# Patient Record
Sex: Male | Born: 1990 | Race: White | Hispanic: No | Marital: Single | Smoking: Never smoker
Health system: Southern US, Community
[De-identification: ages and names within clinical notes are randomized; demographics above are authoritative.]

## PROBLEM LIST (undated history)

## (undated) HISTORY — PX: NOSE SURGERY: SHX723

---
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
## 2012-10-07 ENCOUNTER — Ambulatory Visit (INDEPENDENT_AMBULATORY_CARE_PROVIDER_SITE_OTHER): Payer: BC Managed Care – PPO | Admitting: Family Medicine

## 2012-10-07 ENCOUNTER — Encounter: Payer: Self-pay | Admitting: Family Medicine

## 2012-10-07 VITALS — BP 110/80 | HR 72 | Temp 99.2°F | Resp 12 | Ht 70.25 in | Wt 221.0 lb

## 2012-10-07 DIAGNOSIS — K219 Gastro-esophageal reflux disease without esophagitis: Secondary | ICD-10-CM | POA: Insufficient documentation

## 2012-10-07 MED ORDER — OMEPRAZOLE 40 MG PO CPDR: 40.0000 mg | DELAYED_RELEASE_CAPSULE | Freq: Every day | ORAL | Status: DC

## 2012-10-07 NOTE — Progress Notes (Signed)
  Subjective:    Patient ID: Hector Navarro, male    DOB: Jun 01, 1991, 22 y.o.   MRN: [id number]  HPI Here to establish care History of GERD treated for the past 5 years with omeprazole 40 mg daily Several times he's tried tapering this off but has had breakthrough symptoms. No dysphagia. No appetite or weight changes. He is aware of specific foods that aggravate and tries to avoid these. No history of nicotine or alcohol use  No prior surgeries. Family history significant for father had history of alcohol abuse. Mother bipolar disorder.  Patient is married. Just graduated from college. 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 No past surgical history on file.  reports that he has never smoked. He does not have any smokeless tobacco history on file. His alcohol and drug histories are not on file. family history is not on file. No Known Allergies    Review of Systems  Constitutional: Negative for appetite change and unexpected weight change.  Respiratory: Negative for cough and shortness of breath.   Cardiovascular: Negative for chest pain.  Gastrointestinal: Negative for nausea, vomiting and abdominal pain.       Objective:   Physical Exam  Constitutional: He appears well-developed and well-nourished.  Neck: Neck supple. No thyromegaly present.  Cardiovascular: Normal rate and regular rhythm.   No murmur heard. Pulmonary/Chest: Effort normal and breath sounds normal. No respiratory distress. He has no wheezes. He has no rales.          Assessment & Plan:  GERD. Well controlled with omeprazole. Instructional sheet given on GERD control/risk factor modification. Refill omeprazole.

## 2012-10-07 NOTE — Patient Instructions (Addendum)

## 2013-09-11 ENCOUNTER — Ambulatory Visit (INDEPENDENT_AMBULATORY_CARE_PROVIDER_SITE_OTHER): Payer: BC Managed Care – PPO | Admitting: Family Medicine

## 2013-09-11 ENCOUNTER — Encounter: Payer: Self-pay | Admitting: Family Medicine

## 2013-09-11 VITALS — BP 120/90 | HR 98 | Temp 97.8°F | Wt 213.0 lb

## 2013-09-11 DIAGNOSIS — F1810 Inhalant abuse, uncomplicated: Secondary | ICD-10-CM

## 2013-09-11 DIAGNOSIS — F1910 Other psychoactive substance abuse, uncomplicated: Secondary | ICD-10-CM

## 2013-09-11 NOTE — Patient Instructions (Signed)
Inhalant Abuse  Inhalant abuse is the intentional breathing in (inhalation) of substances to obtain a feeling of well being (euphoria). Inhalants produce vapors. When these vapors are inhaled, they produce a mind-altering effect. Although other abused substances can be inhaled, the term "inhalants" is used to describe substances that are most often inhaled, rather than taken in a different way. Inhalants are a broad range of chemicals that have different effects and are found in different products. These chemicals are put into four general categories. They are:   · Solvents.  · Aerosols.  · Gases.  · Nitrites.  Many of these substances are found in common household products like gasoline, carpet cleaner, spray paints, air fresheners, glues, lighter fluids, hair spray, nail polish remover and other solvents. These substances are abused by all ages.  HOW INHALANTS ARE USED  Inhalants can be breathed in through the nose or the mouth. This can be done in different ways, such as:  · Sniffing or snorting fumes from containers.  · Spraying aerosols directly into the nose or mouth.  · Bagging. This is sniffing or inhaling fumes from substances sprayed or put inside a plastic or paper bag.  · Huffing. This is when an inhalant-soaked rag is stuffed in the mouth.  · Inhaling from balloons filled with nitrous oxide.  HOW INHALANTS WORK  Most inhalants produce a rapid high that resembles alcohol intoxication. Inhaled chemicals are absorbed rapidly into the bloodstream through the lungs and quickly reach the brain and other organs. Within seconds of inhalation, the user experiences intoxication along with other effects. These effects may include:  · Slurred speech.  · Inability to coordinate movements.  · Euphoria.  · Dizziness.  · Lightheadedness.  · Hallucinations.  · Delusions.  Intoxication only lasts a few minutes so abusers often try to prolong the high. They do this by inhaling repeatedly over several hours. This is a very  dangerous practice. Abusers can suffer loss of consciousness and possibly even death. At the least, they will feel less inhibited and less in control. After heavy use of inhalants, abusers may feel drowsy for several hours and experience a lingering headache.  COMMONLY ABUSED INHALANTS AND THEIR EFFECTS:  · Amyl nitrite (common street names are poppers and pearls). Inhalation of these nitrites causes dizziness, lightheadedness, irregular heartbeat, blurred vision, headaches, nausea (feeling sick to your stomach), and burning in the nose. Because these medications enlarge small blood vessels, they can cause low blood pressure, fainting, and a rapid heart rate. This inhalant may also cause chest pain, shortness of breath, and confusion. Death can be an end result of using this inhalant.  · Nitrous oxide (also called laughing gas, buzz bomb, whippets, and shoot the breeze). Nitrous oxide is a gas used commonly in dentistry. The gas causes harm when people using it do not get enough oxygen. This can result in headaches, dizziness, excitation, depression of the central nervous system, seizures (convulsions) and death. Long-term use of this inhalant can cause nerve damage leading to weakness, uncoordinated or unsteady movements, and numbness and tingling in the arms and legs. This inhalant, when used over long periods of time, also leads to low red blood cells (anemia) and problems with blood clotting. When taken into the body, nitrous oxide actually is changed into nitric acid.  · Glues, sealants or glue-like chemicals. Inhaled into the lungs, these substances interfere with the transfer of oxygen to the bloodstream. The body can suffocate from lack of oxygen.  · Toluene. This   material is a solvent found in correction fluids, paint thinners and some nail-polish removers. It causes a brain tissue loss. It can also cause kidney problems, hearing and vision loss and damage to chromosomes.  · Benzene. This is a gasoline  additive. It causes serious injury to bone marrow and damage to the immune system. It is poisonous to sex organs.  · Trichloroethylene. This solvent found in glues, sealants and paints. It causes liver damage, injury to heart muscles and sex organs.  SUDDEN SNIFFING DEATH  The highly concentrated chemicals in solvents or aerosol sprays can induce an irregular and rapid heartbeat. It can also lead to a fatal heart failure within minutes of a session of prolonged sniffing. This syndrome, known as "sudden sniffing death," can result from a single session of inhalant use by an otherwise healthy young person.  Sudden sniffing death is associated most often with the abuse of butane, propane and chemicals in aerosols. Inhalant abuse also can cause death by:   · Asphyxiation from repeated inhalations, which leads to high concentrations of inhaled fumes displacing the available oxygen in the lungs.  · Suffocation from blocking air from entering the lungs when inhaling fumes from a plastic bag placed over the head.  · Convulsions or seizures from abnormal electrical discharges in the brain.  · Coma from the brain shutting down all but the most vital functions.  · Choking from inhalation of vomit after inhalant use.  · Fatal injury from accidents, including motor vehicle fatalities, suffered while intoxicated.  SIGNS OF INHALANT USE  · Breath and clothing that smell like chemicals.  · Spots or sores around the mouth.  · Paint or stains on body or clothing.  · Dazed or glassy-eyed look.  · Hidden empty spray paint or solvent containers and chemical- soaked rags or clothing.  · Nausea or loss of appetite.  · Anxiety, excitability or irritability.  · Slurred speech.  · Red or runny nose.  · Inattentiveness, lack of coordination, irritability, and depression.  TREATMENT   The initial treatment of inhalant use is usually supportive in the emergency department. Most often, if serious complications do not occur, the patient may be  allowed to go home with a responsible adult after a period of monitoring for a short time to several hours. IV (intravenous) therapy may be required for low blood pressures. Oxygen therapy is also necessary, along with removal of any inhalants of exposure (clothing, vials, cans, etc.). Cough is a common complication because the lungs get irritated.  X-ray studies may be performed to check the how the lungs are doing. Re-check with your caregiver after the radiologist (a specialist in reading x-rays) has read your x-rays. Do this to make sure your caregivers agree with the initial x-ray readings by the Emergency Department doctor. Do this also to determine when or if further studies or exams are needed. Ask your caregiver how they will inform you about your radiology (x-ray) follow up results.  After initial treatment has been administered and the immediate health concerns have been addressed, it is so important that substance abuse evaluation and treatment be started without delay. It is usually not possible to determine during an emergency hospital visit how serious the inhalant abuse situation may be, but anyone who receives hospital care for inhalant abuse is in danger of recurrence. It is not infrequent for inhalant abusers to suffer more serious complications (including death) before substance abuse treatment can occur.  HOME CARE INSTRUCTIONS   · Cough   suppressants may be used if you are losing rest. However, coughing is a protective mechanism for clearing our lungs. It is one reason to avoid cough suppressants. Sleeping in a semi-upright position in a recliner or using a couple of pillows will help with this.  · Only take over-the-counter or prescription medicines for pain, discomfort or fever as directed by your caregiver.  · Smoking is a common aggravating factor. Stop smoking.  · Get rest as you feel it is needed.  SEEK MEDICAL CARE IF:   · You have increasing shortness of breath.  · You cannot control  your cough with suppressants and are losing sleep.  · You begin coughing up blood.  · You develop pain that is getting worse or is uncontrolled with medications.  · You develop an oral temperature above 102° F (38.9° C) after not having a temperature for one or more days, or as your caregiver suggests.  · You cough up sputum (thick phlegm), or become sicker.  · If any of the symptoms that initially brought you in for care are getting worse rather than better, or are not controlled with medications.  · Continued abuse of inhalants occurs.  MAKE SURE YOU:   · Understand these instructions.  · Will watch your condition.  · Will get help right away if you are not doing well or get worse.  For more information call the National Inhalant Prevention Coalition's HOTLINE (800) 269-4237, or look on the web at www.inhalants.org.  Document Released: 09/25/2000 Document Revised: 09/11/2011 Document Reviewed: 08/13/2008  [employer]® Patient Information ©2014 [employer].

## 2013-09-11 NOTE — Progress Notes (Signed)
Subjective:    Patient ID: Hector Navarro, male    DOB: 05/29/1991, 23 y.o.   MRN: [id number]  HPI Patient is seen with several month history of inhalant abuse. Starting around last June he started using cleaning solution with difluroethane.  For several months he used this somewhat infrequently. Around October of last year he was found unconscious at his desk at work after using this inhaler and was fired. From November until January of this year he was using about one can per day about 4 days per week. Since early February he has escalated to about 2-3 cans per day about 4 days per week. He recently got into trouble with work because of frequent daytime absences from his work when he was going to his car to use the inhaler. He brought this to disclosure with his wife last night.  He is earnestly seeking help at this time. He has no history of prior abuse of any other drugs. Rarely uses alcohol. He does describe episode earlier this week where he regained normal consciousness and had some urine incontinence. He denies history of known seizure. With inhaler use he generally has some nausea but has euphoria, lightheadedness, and hallucinations. He has not had prior history of withdrawal type symptoms. He frequently does not use this on the weekends and has gone as long as 3 or 4 days without use without difficulty. He has no history of any cardiac problems. Generally feels well his time. No headaches. No dizziness.  He denies history of depression. He is ready to seek addiction counseling and treatment  Questionable history of IgA nephropathy in childhood otherwise no chronic medical problems  No past medical history on file. No past surgical history on file.  reports that he has never smoked. He does not have any smokeless tobacco history on file. His alcohol and drug histories are not on file. family history is not on file. No Known Allergies    Review of Systems  Constitutional: Negative  for fever, chills and unexpected weight change.  HENT: Positive for congestion and nosebleeds.   Respiratory: Negative for cough and shortness of breath.   Cardiovascular: Negative for chest pain, palpitations and leg swelling.  Gastrointestinal: Negative for abdominal pain.  Endocrine: Negative for polydipsia and polyuria.  Genitourinary: Negative for hematuria.  Neurological: Negative for dizziness and headaches.  Psychiatric/Behavioral: Negative for confusion and dysphoric mood.       Objective:   Physical Exam  Constitutional: He is oriented to person, place, and time. He appears well-developed and well-nourished.  HENT:  Right Ear: External ear normal.  Left Ear: External ear normal.  Mouth/Throat: Oropharynx is clear and moist.  Minimal crusted blood left naris  Neck: Neck supple. No thyromegaly present.  Cardiovascular: Normal rate and regular rhythm.   No murmur heard. Pulmonary/Chest: Effort normal and breath sounds normal. No respiratory distress. He has no wheezes. He has no rales.  Abdominal: Soft. He exhibits no distension and no mass. There is no tenderness. There is no rebound and no guarding.  Musculoskeletal: He exhibits no edema.  Lymphadenopathy:    He has no cervical adenopathy.  Neurological: He is alert and oriented to person, place, and time.  Psychiatric: He has a normal mood and affect. His behavior is normal. Judgment and thought content normal.          Assessment & Plan:  Inhalant abuse. We discussed multiple adverse risks including short and long-term risks. We discussed issues of possible sudden  sniffing death syndrome and risk of potentially fatal cardiac arrhythmias with any further use. He is aware that even one further use of this type of inhalant could lead to death. We had a long discussion with patient and wife about treatment options including possible inpatient treatment. At this point, they have already made contact with [facility]  for evaluation.  We obtained screening labs with CBC, basic metabolic panel, and hepatic panel. He is also aware of potential organ complications related to chronic inhalant abuse.

## 2013-09-11 NOTE — Progress Notes (Signed)
Pre visit review using our clinic review tool, if applicable. No additional management support is needed unless otherwise documented below in the visit note. 

## 2013-09-12 LAB — CBC WITH DIFFERENTIAL/PLATELET
BASOS ABS: 0 10*3/uL (ref 0.0–0.1)
Basophils Relative: 0.3 % (ref 0.0–3.0)
EOS ABS: 0.1 10*3/uL (ref 0.0–0.7)
Eosinophils Relative: 0.8 % (ref 0.0–5.0)
HCT: 47.8 % (ref 39.0–52.0)
Hemoglobin: 16.3 g/dL (ref 13.0–17.0)
Lymphocytes Relative: 22.9 % (ref 12.0–46.0)
Lymphs Abs: 1.8 10*3/uL (ref 0.7–4.0)
MCHC: 34.1 g/dL (ref 30.0–36.0)
MCV: 89.6 fl (ref 78.0–100.0)
Monocytes Absolute: 0.6 10*3/uL (ref 0.1–1.0)
Monocytes Relative: 7.5 % (ref 3.0–12.0)
NEUTROS PCT: 68.5 % (ref 43.0–77.0)
Neutro Abs: 5.5 10*3/uL (ref 1.4–7.7)
PLATELETS: 294 10*3/uL (ref 150.0–400.0)
RBC: 5.34 Mil/uL (ref 4.22–5.81)
RDW: 13.9 % (ref 11.5–14.6)
WBC: 8 10*3/uL (ref 4.5–10.5)

## 2013-09-12 LAB — BASIC METABOLIC PANEL
BUN: 10 mg/dL (ref 6–23)
CO2: 26 mEq/L (ref 19–32)
Calcium: 10.2 mg/dL (ref 8.4–10.5)
Chloride: 105 mEq/L (ref 96–112)
Creatinine, Ser: 1 mg/dL (ref 0.4–1.5)
GFR: 96.21 mL/min (ref >60.00)
Glucose, Bld: 99 mg/dL (ref 70–99)
Potassium: 4.5 mEq/L (ref 3.5–5.1)
Sodium: 141 mEq/L (ref 135–145)

## 2013-09-12 LAB — HEPATIC FUNCTION PANEL
ALBUMIN: 5.2 g/dL (ref 3.5–5.2)
ALT: 39 U/L (ref 0–53)
AST: 28 U/L (ref 0–37)
Alkaline Phosphatase: 92 U/L (ref 39–117)
BILIRUBIN DIRECT: 0 mg/dL (ref 0.0–0.3)
Total Bilirubin: 1.1 mg/dL (ref 0.3–1.2)
Total Protein: 8 g/dL (ref 6.0–8.3)

## 2013-10-06 ENCOUNTER — Other Ambulatory Visit: Payer: Self-pay | Admitting: Family Medicine

## 2014-01-06 ENCOUNTER — Ambulatory Visit (INDEPENDENT_AMBULATORY_CARE_PROVIDER_SITE_OTHER): Payer: BC Managed Care – PPO | Admitting: Family Medicine

## 2014-01-06 ENCOUNTER — Encounter: Payer: Self-pay | Admitting: Family Medicine

## 2014-01-06 VITALS — BP 128/80 | HR 76 | Temp 98.6°F | Ht 70.0 in | Wt 221.0 lb

## 2014-01-06 DIAGNOSIS — Z0000 Encounter for general adult medical examination without abnormal findings: Secondary | ICD-10-CM

## 2014-01-06 DIAGNOSIS — Z113 Encounter for screening for infections with a predominantly sexual mode of transmission: Secondary | ICD-10-CM

## 2014-01-06 DIAGNOSIS — Z111 Encounter for screening for respiratory tuberculosis: Secondary | ICD-10-CM

## 2014-01-06 NOTE — Patient Instructions (Signed)
Return in 48 to 72 hours to have PPD read.

## 2014-01-06 NOTE — Progress Notes (Signed)
Pre visit review using our clinic review tool, if applicable. No additional management support is needed unless otherwise documented below in the visit note. 

## 2014-01-06 NOTE — Progress Notes (Signed)
   Subjective:    Patient ID: Hector Navarro, male    DOB: January 22, 1991, 23 y.o.   MRN: [id number]  HPI  Here for well assessment/physical exam. Patient has history of addiction behavior with alcohol, inhalant abuse (difluroethane), and pornography. He was doing well until few weeks ago when he had relapse. He's been getting regular addiction counseling. He is preparing to enroll in one year [facility] program which will be an intense in residence one year program.  Nonsmoker and denies any other illicit drug use.  He has not had any history of communicable diseases but is requesting PPD testing along with STD screening. He is monogamous and has had no exposure to STD or prior history of STD. Denies any skin rashes or any urinary symptoms.  No past medical history on file. No past surgical history on file.  reports that he has never smoked. He does not have any smokeless tobacco history on file. His alcohol and drug histories are not on file. family history is not on file. No Known Allergies    Review of Systems  Constitutional: Negative for fever, activity change, appetite change and fatigue.  HENT: Negative for congestion, ear pain and trouble swallowing.   Eyes: Negative for pain and visual disturbance.  Respiratory: Negative for cough, shortness of breath and wheezing.   Cardiovascular: Negative for chest pain and palpitations.  Gastrointestinal: Negative for nausea, vomiting, abdominal pain, diarrhea, constipation, blood in stool, abdominal distention and rectal pain.  Genitourinary: Negative for dysuria, hematuria and testicular pain.  Musculoskeletal: Negative for arthralgias and joint swelling.  Skin: Negative for rash.  Neurological: Negative for dizziness, syncope and headaches.  Hematological: Negative for adenopathy.  Psychiatric/Behavioral: Negative for confusion and dysphoric mood.       Objective:   Physical Exam  Constitutional: He is oriented to person, place,  and time. He appears well-developed and well-nourished. No distress.  HENT:  Head: Normocephalic and atraumatic.  Right Ear: External ear normal.  Left Ear: External ear normal.  Mouth/Throat: Oropharynx is clear and moist.  Eyes: Conjunctivae and EOM are normal. Pupils are equal, round, and reactive to light.  Neck: Normal range of motion. Neck supple. No thyromegaly present.  Cardiovascular: Normal rate, regular rhythm and normal heart sounds.   No murmur heard. Pulmonary/Chest: No respiratory distress. He has no wheezes. He has no rales.  Abdominal: Soft. Bowel sounds are normal. He exhibits no distension and no mass. There is no tenderness. There is no rebound and no guarding.  Musculoskeletal: He exhibits no edema.  Lymphadenopathy:    He has no cervical adenopathy.  Neurological: He is alert and oriented to person, place, and time. He displays normal reflexes. No cranial nerve deficit.  Skin: No rash noted.  Psychiatric: He has a normal mood and affect.          Assessment & Plan:  #1 physical exam.  Tetanus estimated < 10 years.  Recent labs in March, so not obtained today. #2 STD screening. Check HIV, RPR, GC and Chlamydia by urine probe.no clinical suspicion of STD. #3 history of multiple addictions as above. Patient enrolling in program as above. PPD placed and return in 48-72 hours to have this read.

## 2014-01-07 LAB — RPR

## 2014-01-07 LAB — HIV ANTIBODY (ROUTINE TESTING W REFLEX): HIV 1&2 Ab, 4th Generation: NONREACTIVE

## 2014-01-08 LAB — TB SKIN TEST
INDURATION: 0 mm
TB Skin Test: NEGATIVE

## 2014-01-09 LAB — GC/CHLAMYDIA PROBE AMP, URINE
Chlamydia, Swab/Urine, PCR: NEGATIVE
GC PROBE AMP, URINE: NEGATIVE

## 2014-10-10 ENCOUNTER — Other Ambulatory Visit: Payer: Self-pay | Admitting: Family Medicine

## 2015-01-26 ENCOUNTER — Other Ambulatory Visit: Payer: Self-pay | Admitting: Family Medicine

## 2015-02-20 ENCOUNTER — Other Ambulatory Visit: Payer: Self-pay | Admitting: Family Medicine

## 2015-02-24 ENCOUNTER — Other Ambulatory Visit: Payer: Self-pay | Admitting: Family Medicine

## 2015-04-13 ENCOUNTER — Encounter: Payer: Self-pay | Admitting: Family Medicine

## 2015-04-13 ENCOUNTER — Ambulatory Visit (INDEPENDENT_AMBULATORY_CARE_PROVIDER_SITE_OTHER): Payer: BLUE CROSS/BLUE SHIELD | Admitting: Family Medicine

## 2015-04-13 VITALS — BP 120/90 | HR 82 | Temp 97.8°F | Ht 70.08 in | Wt 218.0 lb

## 2015-04-13 DIAGNOSIS — G2581 Restless legs syndrome: Secondary | ICD-10-CM | POA: Diagnosis not present

## 2015-04-13 DIAGNOSIS — F1810 Inhalant abuse, uncomplicated: Secondary | ICD-10-CM

## 2015-04-13 DIAGNOSIS — M545 Low back pain, unspecified: Secondary | ICD-10-CM

## 2015-04-13 DIAGNOSIS — Z0000 Encounter for general adult medical examination without abnormal findings: Secondary | ICD-10-CM

## 2015-04-13 DIAGNOSIS — F1811 Inhalant abuse, in remission: Secondary | ICD-10-CM

## 2015-04-13 LAB — POCT URINALYSIS DIPSTICK
BILIRUBIN UA: NEGATIVE
Glucose, UA: NEGATIVE
Ketones, UA: NEGATIVE
LEUKOCYTES UA: NEGATIVE
NITRITE UA: NEGATIVE
PH UA: 7
RBC UA: NEGATIVE
Spec Grav, UA: 1.02
UROBILINOGEN UA: 0.2

## 2015-04-13 LAB — CBC WITH DIFFERENTIAL/PLATELET
Basophils Absolute: 0.1 10*3/uL (ref 0.0–0.1)
Basophils Relative: 0.8 % (ref 0.0–3.0)
EOS PCT: 1.7 % (ref 0.0–5.0)
Eosinophils Absolute: 0.1 10*3/uL (ref 0.0–0.7)
HCT: 47.7 % (ref 39.0–52.0)
Hemoglobin: 16.4 g/dL (ref 13.0–17.0)
LYMPHS ABS: 2.4 10*3/uL (ref 0.7–4.0)
Lymphocytes Relative: 36.5 % (ref 12.0–46.0)
MCHC: 34.4 g/dL (ref 30.0–36.0)
MCV: 89.1 fl (ref 78.0–100.0)
MONO ABS: 0.7 10*3/uL (ref 0.1–1.0)
MONOS PCT: 10.8 % (ref 3.0–12.0)
NEUTROS ABS: 3.3 10*3/uL (ref 1.4–7.7)
NEUTROS PCT: 50.2 % (ref 43.0–77.0)
PLATELETS: 250 10*3/uL (ref 150.0–400.0)
RBC: 5.35 Mil/uL (ref 4.22–5.81)
RDW: 14.1 % (ref 11.5–15.5)
WBC: 6.6 10*3/uL (ref 4.0–10.5)

## 2015-04-13 LAB — BASIC METABOLIC PANEL
BUN: 16 mg/dL (ref 6–23)
CALCIUM: 10.2 mg/dL (ref 8.4–10.5)
CO2: 28 meq/L (ref 19–32)
Chloride: 103 mEq/L (ref 96–112)
Creatinine, Ser: 0.89 mg/dL (ref 0.40–1.50)
GFR: 112.27 mL/min (ref >60.00)
GLUCOSE: 86 mg/dL (ref 70–99)
POTASSIUM: 4.1 meq/L (ref 3.5–5.1)
SODIUM: 141 meq/L (ref 135–145)

## 2015-04-13 LAB — HEPATIC FUNCTION PANEL
ALBUMIN: 4.9 g/dL (ref 3.5–5.2)
ALT: 31 U/L (ref 0–53)
AST: 22 U/L (ref 0–37)
Alkaline Phosphatase: 44 U/L (ref 39–117)
Bilirubin, Direct: 0.1 mg/dL (ref 0.0–0.3)
TOTAL PROTEIN: 7.9 g/dL (ref 6.0–8.3)
Total Bilirubin: 0.8 mg/dL (ref 0.2–1.2)

## 2015-04-13 LAB — LIPID PANEL
Cholesterol: 201 mg/dL — ABNORMAL HIGH (ref 0–200)
HDL: 52.9 mg/dL (ref >39.00)
LDL Cholesterol: 125 mg/dL — ABNORMAL HIGH (ref 0–99)
NONHDL: 147.83
TRIGLYCERIDES: 116 mg/dL (ref 0.0–149.0)
Total CHOL/HDL Ratio: 4
VLDL: 23.2 mg/dL (ref 0.0–40.0)

## 2015-04-13 LAB — TSH: TSH: 1.47 u[IU]/mL (ref 0.35–4.50)

## 2015-04-13 MED ORDER — OMEPRAZOLE 40 MG PO CPDR: 40.0000 mg | DELAYED_RELEASE_CAPSULE | Freq: Every day | ORAL | Status: DC

## 2015-04-13 MED ORDER — TETANUS-DIPHTH-ACELL PERTUSSIS 5-2.5-18.5 LF-MCG/0.5 IM SUSP
0.5000 mL | Freq: Once | INTRAMUSCULAR | Status: AC
  Administered 2015-04-13: 0.5 mL via INTRAMUSCULAR

## 2015-04-13 MED ORDER — PRAMIPEXOLE DIHYDROCHLORIDE 0.125 MG PO TABS: ORAL_TABLET | ORAL | Status: DC

## 2015-04-13 NOTE — Progress Notes (Signed)
Subjective:    Patient ID: Hector Navarro, male    DOB: 12/18/1990, 24 y.o.   MRN: [id number]  HPI Patient seen for complete physical. He takes omeprazole for reflux and requesting refills. This is controlled on 40 mg. He has restless leg syndrome and has had frequent symptoms especially at night but symptoms also during the day. He is cautious to avoid caffeine. No alcohol use. No history of anemia. Symptoms are very disabling for him and his wife at times. He would like to explore other treatments. He's never been treated medically for this.  History of reported scoliosis. He has frequent low back pain lumbar area mostly on the right side. Has seen chiropractor in the past but has never had physical therapy. He has daily pain with bending. He is currently working with [employer] and lifting and bending does exacerbate  Question of IgA nephropathy in childhood. Apparently this was not well confirmed. He's apparently not had any proteinuria or other concerns all follow-up labs since then  He has history of substance abuse with inhalant abuse and finished a year-long treatment program recently and has done extremely well since then. No recent relapse  No past medical history on file. No past surgical history on file.  reports that he has never smoked. He does not have any smokeless tobacco history on file. His alcohol and drug histories are not on file. family history is not on file. No Known Allergies    Review of Systems  Constitutional: Negative for fever, activity change, appetite change, fatigue and unexpected weight change.  HENT: Negative for congestion, ear pain and trouble swallowing.   Eyes: Negative for pain and visual disturbance.  Respiratory: Negative for cough, shortness of breath and wheezing.   Cardiovascular: Negative for chest pain and palpitations.  Gastrointestinal: Negative for nausea, vomiting, abdominal pain, diarrhea, constipation, blood in stool,  abdominal distention and rectal pain.  Genitourinary: Negative for dysuria, hematuria and testicular pain.  Musculoskeletal: Positive for back pain. Negative for joint swelling, arthralgias and neck stiffness.  Skin: Negative for rash.  Neurological: Negative for dizziness, syncope and headaches.  Hematological: Negative for adenopathy.  Psychiatric/Behavioral: Negative for confusion and dysphoric mood.       Objective:   Physical Exam  Constitutional: He is oriented to person, place, and time. He appears well-developed and well-nourished. No distress.  HENT:  Head: Normocephalic and atraumatic.  Right Ear: External ear normal.  Left Ear: External ear normal.  Mouth/Throat: Oropharynx is clear and moist.  Eyes: Conjunctivae and EOM are normal. Pupils are equal, round, and reactive to light.  Neck: Normal range of motion. Neck supple. No thyromegaly present.  Cardiovascular: Normal rate, regular rhythm and normal heart sounds.   No murmur heard. Pulmonary/Chest: No respiratory distress. He has no wheezes. He has no rales.  Abdominal: Soft. Bowel sounds are normal. He exhibits no distension and no mass. There is no tenderness. There is no rebound and no guarding.  Musculoskeletal: He exhibits no edema.  Lymphadenopathy:    He has no cervical adenopathy.  Neurological: He is alert and oriented to person, place, and time. He displays normal reflexes. No cranial nerve deficit.  Skin: No rash noted.  Psychiatric: He has a normal mood and affect.          Assessment & Plan:  Complete physical. Obtain screening labs. Tetanus booster given. Set up physical therapy for his low back pain. He declines flu vaccine.  Restless leg syndrome. Continue to  avoid caffeine. Check labs as above. Symptoms are disabling-at times. We discussed options. Avoid drugs like Klonopin with history of substance abuse. Trial of Mirapex 0.125 mg daily at bedtime and titrate 0.25 mg after couple nights if  necessary

## 2015-04-13 NOTE — Patient Instructions (Signed)
Restless Legs Syndrome Restless legs syndrome is a condition that causes uncomfortable feelings or sensations in the legs, especially while sitting or lying down. The sensations usually cause an overwhelming urge to move the legs. The arms can also sometimes be affected. The condition can range from mild to severe. The symptoms often interfere with a person's ability to sleep. CAUSES The cause of this condition is not known. RISK FACTORS This condition is more likely to develop in:  People who are older than age 50.  Pregnant women. In general, restless legs syndrome is more common in women than in men.  People who have a family history of the condition.  People who have certain medical conditions, such as iron deficiency, kidney disease, Parkinson disease, or nerve damage.  People who take certain medicines, such as medicines for high blood pressure, nausea, colds, allergies, depression, and some heart conditions. SYMPTOMS The main symptom of this condition is uncomfortable sensations in the legs. These sensations may be:  Described as pulling, tingling, prickling, throbbing, crawling, or burning.  Worse while you are sitting or lying down.  Worse during periods of rest or inactivity.  Worse at night, often interfering with your sleep.  Accompanied by a very strong urge to move your legs.  Temporarily relieved by movement of your legs. The sensations usually affect both sides of the body. The arms can also be affected, but this is rare. People who have this condition often have tiredness during the day because of their lack of sleep at night. DIAGNOSIS This condition may be diagnosed based on your description of the symptoms. You may also have tests, including blood tests, to check for other conditions that may lead to your symptoms. In some cases, you may be asked to spend some time in a sleep lab so your sleeping can be monitored. TREATMENT Treatment for this condition is  focused on managing the symptoms. Treatment may include:  Self-help and lifestyle changes.  Medicines. HOME CARE INSTRUCTIONS  Take medicines only as directed by your health care provider.  Try these methods to get temporary relief from the uncomfortable sensations:  Massage your legs.  Walk or stretch.  Take a cold or hot bath.  Practice good sleep habits. For example, go to bed and get up at the same time every day.  Exercise regularly.  Practice ways of relaxing, such as yoga or meditation.  Avoid caffeine and alcohol.  Do not use any tobacco products, including cigarettes, chewing tobacco, or electronic cigarettes. If you need help quitting, ask your health care provider.  Keep all follow-up visits as directed by your health care provider. This is important. SEEK MEDICAL CARE IF: Your symptoms do not improve with treatment, or they get worse.   This information is not intended to replace advice given to you by your health care provider. Make sure you discuss any questions you have with your health care provider.   Document Released: 06/09/2002 Document Revised: 11/03/2014 Document Reviewed: 06/15/2014 Elsevier Interactive Patient Education 2016 Elsevier Inc.  

## 2015-04-13 NOTE — Progress Notes (Signed)
Pre visit review using our clinic review tool, if applicable. No additional management support is needed unless otherwise documented below in the visit note. 

## 2015-04-13 NOTE — Addendum Note (Signed)
Addended by: Shade Flood on: 04/13/2015 10:04 AM   Modules accepted: Orders

## 2015-04-14 ENCOUNTER — Telehealth: Payer: Self-pay

## 2015-04-14 NOTE — Telephone Encounter (Signed)
-----   Message from Kristian CoveyBruce W Burchette, MD sent at 04/13/2015  5:06 PM EDT ----- Labs all OK except for minimally elevated cholesterol.

## 2015-04-14 NOTE — Telephone Encounter (Signed)
Left message for patient to call office regarding labs.  First number called was incorrect so need updated phone number.

## 2015-05-12 ENCOUNTER — Other Ambulatory Visit: Payer: Self-pay

## 2016-01-19 ENCOUNTER — Telehealth: Payer: Self-pay | Admitting: Family Medicine

## 2016-01-19 MED ORDER — OMEPRAZOLE 40 MG PO CPDR: 40.0000 mg | DELAYED_RELEASE_CAPSULE | Freq: Every day | ORAL | Status: DC

## 2016-01-19 MED ORDER — PRAMIPEXOLE DIHYDROCHLORIDE 0.125 MG PO TABS: ORAL_TABLET | ORAL | Status: DC

## 2016-01-19 NOTE — Telephone Encounter (Signed)
Pt request refill  omeprazole (PRILOSEC) 40 MG capsule Pt has refills on this rx pramipexole (MIRAPEX) 0.125 MG tablet (transferred only, pt has refills)  But pt has new pharmacy and needs these sent to  [facility]

## 2016-01-19 NOTE — Telephone Encounter (Signed)
Appt are up to date, medications sent to new pharmacy.

## 2016-06-16 ENCOUNTER — Other Ambulatory Visit: Payer: Self-pay | Admitting: Family Medicine

## 2016-07-04 ENCOUNTER — Other Ambulatory Visit: Payer: Self-pay | Admitting: Family Medicine

## 2016-07-12 ENCOUNTER — Other Ambulatory Visit: Payer: Self-pay | Admitting: Family Medicine

## 2016-08-15 ENCOUNTER — Other Ambulatory Visit: Payer: Self-pay | Admitting: Family Medicine

## 2016-08-29 ENCOUNTER — Ambulatory Visit (INDEPENDENT_AMBULATORY_CARE_PROVIDER_SITE_OTHER): Payer: Self-pay | Admitting: Family Medicine

## 2016-08-29 ENCOUNTER — Encounter: Payer: Self-pay | Admitting: Family Medicine

## 2016-08-29 VITALS — BP 122/90 | HR 86 | Ht 70.5 in | Wt 218.9 lb

## 2016-08-29 DIAGNOSIS — Z0000 Encounter for general adult medical examination without abnormal findings: Secondary | ICD-10-CM

## 2016-08-29 MED ORDER — PRAMIPEXOLE DIHYDROCHLORIDE 0.125 MG PO TABS: ORAL_TABLET | ORAL | 3 refills | Status: DC

## 2016-08-29 MED ORDER — OMEPRAZOLE 40 MG PO CPDR: 40.0000 mg | DELAYED_RELEASE_CAPSULE | Freq: Every day | ORAL | 3 refills | Status: DC

## 2016-08-29 NOTE — Progress Notes (Signed)
Pre visit review using our clinic review tool, if applicable. No additional management support is needed unless otherwise documented below in the visit note. 

## 2016-08-29 NOTE — Progress Notes (Addendum)
Subjective:     Patient ID: Hector Navarro, male   DOB: Oct 29, 1990, 26 y.o.   MRN: [id number]  HPI Patient seen for physical exam. He has history of GERD and takes omeprazole 40 mg daily. Restless leg syndrome treated with Mirapex 0.125 mg 2 tablets at night and this is controlling his symptoms very well. He minimizes caffeine use. Still has occasional breakthrough GERD symptoms on omeprazole. When he skips medication he has consistent symptoms. He has not been able to taper down to H2 blockers.  Patient is nonsmoker. Married and expecting first child in April. He works with a [employer]. Tetanus up-to-date.  History reviewed. No pertinent past medical history. Past Surgical History:  Procedure Laterality Date  . NOSE SURGERY      reports that he has never smoked. He has never used smokeless tobacco. He reports that he does not drink alcohol or use drugs. family history includes Diabetes in his paternal grandmother; Hyperlipidemia in his maternal grandfather. No Known Allergies   Review of Systems  Constitutional: Negative for activity change, appetite change, fatigue and fever.  HENT: Negative for congestion, ear pain and trouble swallowing.   Eyes: Negative for pain and visual disturbance.  Respiratory: Negative for cough, shortness of breath and wheezing.   Cardiovascular: Negative for chest pain and palpitations.  Gastrointestinal: Negative for abdominal distention, abdominal pain, blood in stool, constipation, diarrhea, nausea, rectal pain and vomiting.  Genitourinary: Negative for dysuria, hematuria and testicular pain.  Musculoskeletal: Negative for arthralgias and joint swelling.  Skin: Negative for rash.  Neurological: Negative for dizziness, syncope and headaches.  Hematological: Negative for adenopathy.  Psychiatric/Behavioral: Negative for confusion and dysphoric mood.       Objective:   Physical Exam  Constitutional: He is oriented to person, place, and  time. He appears well-developed and well-nourished. No distress.  HENT:  Head: Normocephalic and atraumatic.  Right Ear: External ear normal.  Left Ear: External ear normal.  Mouth/Throat: Oropharynx is clear and moist.  Eyes: Conjunctivae and EOM are normal. Pupils are equal, round, and reactive to light.  Neck: Normal range of motion. Neck supple. No thyromegaly present.  Cardiovascular: Normal rate, regular rhythm and normal heart sounds.   No murmur heard. Pulmonary/Chest: No respiratory distress. He has no wheezes. He has no rales.  Abdominal: Soft. Bowel sounds are normal. He exhibits no distension and no mass. There is no tenderness. There is no rebound and no guarding.  Musculoskeletal: He exhibits no edema.  Lymphadenopathy:    He has no cervical adenopathy.  Neurological: He is alert and oriented to person, place, and time. He displays normal reflexes. No cranial nerve deficit.  Skin: Rash noted.  Patient has slightly hyperpigmented macular rash which is slightly scaly across his Topping chest wall scattered in a few areas  Psychiatric: He has a normal mood and affect.       Assessment:     Physical exam. Patient is GERD which is well-controlled omeprazole restless leg syndrome controlled with Mirapex.  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 He has very minimal involvement of this point wishes not to treat currently. Consider either oral ketoconazole one-time  dose or topical selenium sulfide for any progressive rash  Kristian CoveyBruce W Corianna Avallone MD [facility]

## 2016-08-29 NOTE — Patient Instructions (Signed)
Tinea Versicolor Tinea versicolor is a common fungal infection of the skin. It causes a rash that appears as light or dark patches on the skin. The rash most often occurs on the chest, back, neck, or [place] arms. This condition is more common during warm weather. Other than affecting how your skin looks, tinea versicolor usually does not cause other problems. In most cases, the infection goes away in a few weeks with treatment. It may take a few months for the patches on your skin to clear up. What are the causes? Tinea versicolor occurs when a type of fungus that is normally present on the skin starts to overgrow. This fungus is a kind of yeast. The exact cause of the overgrowth is not known. This condition cannot be passed from one person to another (noncontagious). What increases the risk? This condition is more likely to develop when certain factors are present, such as:  Heat and humidity.  Sweating too much.  Hormone changes.  Oily skin.  A weak defense (immune) system. What are the signs or symptoms? Symptoms of this condition may include:  A rash on your skin that is made up of light or dark patches. The rash may have:  Patches of tan or pink spots on light skin.  Patches of white or brown spots on dark skin.  Patches of skin that do not tan.  Well-marked edges.  Scales on the discolored areas.  Mild itching. How is this diagnosed? A health care provider can usually diagnose this condition by looking at your skin. During the exam, he or she may use ultraviolet light to help determine the extent of the infection. In some cases, a skin sample may be taken by scraping the rash. This sample will be viewed under a microscope to check for yeast overgrowth. How is this treated? Treatment for this condition may include:  Dandruff shampoo that is applied to the affected skin during showers or bathing.  Over-the-counter medicated skin cream, lotion, or soaps.  Prescription  antifungal medicine in the form of skin cream or pills.  Medicine to help reduce itching. Follow these instructions at home:  Take medicines only as directed by your health care provider.  Apply dandruff shampoo to the affected area if told to do so by your health care provider. You may be instructed to scrub the affected skin for several minutes each day.  Do not scratch the affected area of skin.  Avoid hot and humid conditions.  Do not use tanning booths.  Try to avoid sweating a lot. Contact a health care provider if:  Your symptoms get worse.  You have a fever.  You have redness, swelling, or pain at the site of your rash.  You have fluid, blood, or pus coming from your rash.  Your rash returns after treatment. This information is not intended to replace advice given to you by your health care provider. Make sure you discuss any questions you have with your health care provider. Document Released: 06/16/2000 Document Revised: 02/20/2016 Document Reviewed: 03/31/2014 Elsevier Interactive Patient Education  2017 Elsevier Inc.  

## 2017-08-13 ENCOUNTER — Telehealth: Payer: Self-pay | Admitting: Family Medicine

## 2017-08-13 MED ORDER — PRAMIPEXOLE DIHYDROCHLORIDE 0.125 MG PO TABS: ORAL_TABLET | ORAL | 0 refills | Status: DC

## 2017-08-13 MED ORDER — OMEPRAZOLE 40 MG PO CPDR: 40.0000 mg | DELAYED_RELEASE_CAPSULE | Freq: Every day | ORAL | 0 refills | Status: DC

## 2017-08-13 NOTE — Telephone Encounter (Signed)
Copied from CRM [id number]. Topic: Quick Communication - Rx Refill/Question >> Aug 13, 2017  9:50 AM Alexander BergeronBarksdale, Hector B wrote: Medication: omeprazole (PRILOSEC) 40 MG capsule [[id number]][[id number]] ,  pramipexole (MIRAPEX) 0.125 MG tablet [[id number]][[id number]]   Pt would like a refill, have them sent to the [facility] on [address] [place]

## 2017-08-13 NOTE — Telephone Encounter (Signed)
Refills sent

## 2017-09-05 ENCOUNTER — Ambulatory Visit (INDEPENDENT_AMBULATORY_CARE_PROVIDER_SITE_OTHER): Payer: 59 | Admitting: Family Medicine

## 2017-09-05 ENCOUNTER — Encounter: Payer: Self-pay | Admitting: Family Medicine

## 2017-09-05 VITALS — BP 110/80 | HR 78 | Temp 98.6°F | Ht 70.5 in | Wt 230.0 lb

## 2017-09-05 DIAGNOSIS — Z0000 Encounter for general adult medical examination without abnormal findings: Secondary | ICD-10-CM

## 2017-09-05 LAB — CBC WITH DIFFERENTIAL/PLATELET
BASOS PCT: 0.9 % (ref 0.0–3.0)
Basophils Absolute: 0.1 10*3/uL (ref 0.0–0.1)
EOS PCT: 1.9 % (ref 0.0–5.0)
Eosinophils Absolute: 0.1 10*3/uL (ref 0.0–0.7)
HCT: 46.5 % (ref 39.0–52.0)
Hemoglobin: 16.4 g/dL (ref 13.0–17.0)
LYMPHS ABS: 2.2 10*3/uL (ref 0.7–4.0)
Lymphocytes Relative: 35.1 % (ref 12.0–46.0)
MCHC: 35.4 g/dL (ref 30.0–36.0)
MCV: 84.8 fl (ref 78.0–100.0)
MONOS PCT: 9.9 % (ref 3.0–12.0)
Monocytes Absolute: 0.6 10*3/uL (ref 0.1–1.0)
NEUTROS ABS: 3.3 10*3/uL (ref 1.4–7.7)
NEUTROS PCT: 52.2 % (ref 43.0–77.0)
PLATELETS: 260 10*3/uL (ref 150.0–400.0)
RBC: 5.48 Mil/uL (ref 4.22–5.81)
RDW: 14.4 % (ref 11.5–15.5)
WBC: 6.3 10*3/uL (ref 4.0–10.5)

## 2017-09-05 LAB — HEPATIC FUNCTION PANEL
ALBUMIN: 4.8 g/dL (ref 3.5–5.2)
ALK PHOS: 38 U/L — AB (ref 39–117)
ALT: 85 U/L — ABNORMAL HIGH (ref 0–53)
AST: 26 U/L (ref 0–37)
Bilirubin, Direct: 0.2 mg/dL (ref 0.0–0.3)
Total Bilirubin: 0.7 mg/dL (ref 0.2–1.2)
Total Protein: 7.4 g/dL (ref 6.0–8.3)

## 2017-09-05 LAB — BASIC METABOLIC PANEL
BUN: 14 mg/dL (ref 6–23)
CALCIUM: 10.2 mg/dL (ref 8.4–10.5)
CO2: 28 mEq/L (ref 19–32)
Chloride: 105 mEq/L (ref 96–112)
Creatinine, Ser: 1.08 mg/dL (ref 0.40–1.50)
GFR: 88.03 mL/min (ref >60.00)
Glucose, Bld: 91 mg/dL (ref 70–99)
POTASSIUM: 4.3 meq/L (ref 3.5–5.1)
SODIUM: 142 meq/L (ref 135–145)

## 2017-09-05 LAB — LIPID PANEL
Cholesterol: 159 mg/dL (ref 0–200)
HDL: 37.4 mg/dL — AB (ref >39.00)
LDL Cholesterol: 101 mg/dL — ABNORMAL HIGH (ref 0–99)
NonHDL: 121.94
TRIGLYCERIDES: 105 mg/dL (ref 0.0–149.0)
Total CHOL/HDL Ratio: 4
VLDL: 21 mg/dL (ref 0.0–40.0)

## 2017-09-05 LAB — TSH: TSH: 2.48 u[IU]/mL (ref 0.35–4.50)

## 2017-09-05 MED ORDER — FLUCONAZOLE 150 MG PO TABS: ORAL_TABLET | ORAL | 2 refills | Status: DC

## 2017-09-05 MED ORDER — OMEPRAZOLE 40 MG PO CPDR: 40.0000 mg | DELAYED_RELEASE_CAPSULE | Freq: Every day | ORAL | 11 refills | Status: DC

## 2017-09-05 MED ORDER — PRAMIPEXOLE DIHYDROCHLORIDE 0.125 MG PO TABS: ORAL_TABLET | ORAL | 11 refills | Status: DC

## 2017-09-05 NOTE — Patient Instructions (Signed)
Tinea Versicolor Tinea versicolor is a common fungal infection of the skin. It causes a rash that appears as light or dark patches on the skin. The rash most often occurs on the chest, back, neck, or [place] arms. This condition is more common during warm weather. Other than affecting how your skin looks, tinea versicolor usually does not cause other problems. In most cases, the infection goes away in a few weeks with treatment. It may take a few months for the patches on your skin to clear up. What are the causes? Tinea versicolor occurs when a type of fungus that is normally present on the skin starts to overgrow. This fungus is a kind of yeast. The exact cause of the overgrowth is not known. This condition cannot be passed from one person to another (noncontagious). What increases the risk? This condition is more likely to develop when certain factors are present, such as:  Heat and humidity.  Sweating too much.  Hormone changes.  Oily skin.  A weak defense (immune) system.  What are the signs or symptoms? Symptoms of this condition may include:  A rash on your skin that is made up of light or dark patches. The rash may have: ? Patches of tan or pink spots on light skin. ? Patches of white or brown spots on dark skin. ? Patches of skin that do not tan. ? Well-marked edges. ? Scales on the discolored areas.  Mild itching.  How is this diagnosed? A health care provider can usually diagnose this condition by looking at your skin. During the exam, he or she may use ultraviolet light to help determine the extent of the infection. In some cases, a skin sample may be taken by scraping the rash. This sample will be viewed under a microscope to check for yeast overgrowth. How is this treated? Treatment for this condition may include:  Dandruff shampoo that is applied to the affected skin during showers or bathing.  Over-the-counter medicated skin cream, lotion, or soaps.  Prescription  antifungal medicine in the form of skin cream or pills.  Medicine to help reduce itching.  Follow these instructions at home:  Take medicines only as directed by your health care provider.  Apply dandruff shampoo to the affected area if told to do so by your health care provider. You may be instructed to scrub the affected skin for several minutes each day.  Do not scratch the affected area of skin.  Avoid hot and humid conditions.  Do not use tanning booths.  Try to avoid sweating a lot. Contact a health care provider if:  Your symptoms get worse.  You have a fever.  You have redness, swelling, or pain at the site of your rash.  You have fluid, blood, or pus coming from your rash.  Your rash returns after treatment. This information is not intended to replace advice given to you by your health care provider. Make sure you discuss any questions you have with your health care provider. Document Released: 06/16/2000 Document Revised: 02/20/2016 Document Reviewed: 03/31/2014 Elsevier Interactive Patient Education  2018 Elsevier Inc.  

## 2017-09-05 NOTE — Progress Notes (Signed)
Subjective:     Patient ID: Hector Navarro, male   DOB: 1990-10-21, 27 y.o.   MRN: [id number]  HPI Patient here for physical exam. He has history of GERD controlled with omeprazole. He has restless legs controlled with Mirapex. Only complaint today is pruritic rash anterior chest which he's had in the past. Usually lotrimen cream helps but he has recurrence.  Tetanus up-to-date. He is married and has 4951-month-old daughter. Doing well overall with no specific complaints  No past medical history on file. Past Surgical History:  Procedure Laterality Date  . NOSE SURGERY      reports that  has never smoked. he has never used smokeless tobacco. He reports that he does not drink alcohol or use drugs. family history includes Diabetes in his paternal grandmother; Hyperlipidemia in his maternal grandfather. No Known Allergies   Review of Systems  Constitutional: Negative for activity change, appetite change, fatigue and fever.  HENT: Negative for congestion, ear pain and trouble swallowing.   Eyes: Negative for pain and visual disturbance.  Respiratory: Negative for cough, shortness of breath and wheezing.   Cardiovascular: Negative for chest pain and palpitations.  Gastrointestinal: Negative for abdominal distention, abdominal pain, blood in stool, constipation, diarrhea, nausea, rectal pain and vomiting.  Genitourinary: Negative for dysuria, hematuria and testicular pain.  Musculoskeletal: Negative for arthralgias and joint swelling.  Skin: Positive for rash.  Neurological: Negative for dizziness, syncope and headaches.  Hematological: Negative for adenopathy.  Psychiatric/Behavioral: Negative for confusion and dysphoric mood.       Objective:   Physical Exam  Constitutional: He is oriented to person, place, and time. He appears well-developed and well-nourished. No distress.  HENT:  Head: Normocephalic and atraumatic.  Right Ear: External ear normal.  Left Ear: External ear normal.   Mouth/Throat: Oropharynx is clear and moist.  Eyes: Conjunctivae and EOM are normal. Pupils are equal, round, and reactive to light.  Neck: Normal range of motion. Neck supple. No thyromegaly present.  Cardiovascular: Normal rate, regular rhythm and normal heart sounds.  No murmur heard. Pulmonary/Chest: No respiratory distress. He has no wheezes. He has no rales.  Abdominal: Soft. Bowel sounds are normal. He exhibits no distension and no mass. There is no tenderness. There is no rebound and no guarding.  Musculoskeletal: He exhibits no edema.  Lymphadenopathy:    He has no cervical adenopathy.  Neurological: He is alert and oriented to person, place, and time. He displays normal reflexes. No cranial nerve deficit.  Skin: Rash noted.  Patient has scaly hyperpigmented rash scattered on anterior chest  Psychiatric: He has a normal mood and affect.       Assessment:     Physical exam. Patient has rash consistent with tenia versicolor    Plan:     -Obtain screening lab work -Fluconazole 150 mg tablets take 2 tablets 1 dose and repeat 2 tablets in one week -Patient is aware that tenia versicolor tends to recur over time Refilled regular medications for one year  Kristian CoveyBruce W Burchette MD [facility]

## 2017-09-10 ENCOUNTER — Telehealth: Payer: Self-pay | Admitting: Family Medicine

## 2017-09-10 ENCOUNTER — Other Ambulatory Visit: Payer: Self-pay | Admitting: Family Medicine

## 2017-09-10 DIAGNOSIS — R7401 Elevation of levels of liver transaminase levels: Secondary | ICD-10-CM

## 2017-09-10 DIAGNOSIS — R740 Nonspecific elevation of levels of transaminase and lactic acid dehydrogenase [LDH]: Principal | ICD-10-CM

## 2017-09-10 NOTE — Telephone Encounter (Signed)
See result note.  

## 2017-09-10 NOTE — Telephone Encounter (Signed)
Copied from CRM [id number]. Topic: Quick Communication - Lab Results >> Sep 10, 2017 12:44 PM Laural BenesJohnson, Louisianahiquita C wrote: Pt returned call for lab results.    CB: [phone]

## 2017-09-25 ENCOUNTER — Encounter: Payer: Self-pay | Admitting: Family Medicine

## 2017-12-17 ENCOUNTER — Other Ambulatory Visit (INDEPENDENT_AMBULATORY_CARE_PROVIDER_SITE_OTHER): Payer: 59

## 2017-12-17 DIAGNOSIS — R740 Nonspecific elevation of levels of transaminase and lactic acid dehydrogenase [LDH]: Secondary | ICD-10-CM | POA: Diagnosis not present

## 2017-12-17 DIAGNOSIS — R7401 Elevation of levels of liver transaminase levels: Secondary | ICD-10-CM

## 2017-12-17 LAB — HEPATIC FUNCTION PANEL
ALBUMIN: 4.9 g/dL (ref 3.5–5.2)
ALT: 77 U/L — AB (ref 0–53)
AST: 41 U/L — AB (ref 0–37)
Alkaline Phosphatase: 47 U/L (ref 39–117)
BILIRUBIN TOTAL: 0.6 mg/dL (ref 0.2–1.2)
Bilirubin, Direct: 0.1 mg/dL (ref 0.0–0.3)
Total Protein: 7.1 g/dL (ref 6.0–8.3)

## 2018-05-03 DIAGNOSIS — Z23 Encounter for immunization: Secondary | ICD-10-CM | POA: Diagnosis not present

## 2018-09-05 ENCOUNTER — Other Ambulatory Visit: Payer: Self-pay | Admitting: Family Medicine

## 2018-09-24 ENCOUNTER — Other Ambulatory Visit: Payer: Self-pay | Admitting: Family Medicine

## 2018-09-25 ENCOUNTER — Ambulatory Visit (INDEPENDENT_AMBULATORY_CARE_PROVIDER_SITE_OTHER): Payer: 59 | Admitting: Family Medicine

## 2018-09-25 ENCOUNTER — Other Ambulatory Visit: Payer: Self-pay

## 2018-09-25 DIAGNOSIS — K219 Gastro-esophageal reflux disease without esophagitis: Secondary | ICD-10-CM | POA: Diagnosis not present

## 2018-09-25 DIAGNOSIS — R7401 Elevation of levels of liver transaminase levels: Secondary | ICD-10-CM | POA: Insufficient documentation

## 2018-09-25 DIAGNOSIS — G2581 Restless legs syndrome: Secondary | ICD-10-CM | POA: Diagnosis not present

## 2018-09-25 DIAGNOSIS — R740 Nonspecific elevation of levels of transaminase and lactic acid dehydrogenase [LDH]: Secondary | ICD-10-CM

## 2018-09-25 MED ORDER — OMEPRAZOLE 40 MG PO CPDR: 40.0000 mg | DELAYED_RELEASE_CAPSULE | Freq: Every day | ORAL | 3 refills | Status: DC

## 2018-09-25 MED ORDER — PRAMIPEXOLE DIHYDROCHLORIDE 0.25 MG PO TABS: 0.2500 mg | ORAL_TABLET | Freq: Every day | ORAL | 3 refills | Status: DC

## 2018-09-25 NOTE — Progress Notes (Signed)
Virtual Visit via Video Note  I connected with Hector Navarro on 09/25/18 at  4:15 PM EDT by a video enabled telemedicine application and verified that I am speaking with the correct person using two identifiers.  Location patient: home Location provider:work or home office Persons participating in the virtual visit: patient, provider  I discussed the limitations of evaluation and management by telemedicine and the availability of in person appointments. The patient expressed understanding and agreed to proceed.   HPI: Patient is seen by WebEx visit follow-up for the following issues  Restless leg syndrome.  We started Mirapex last year and is currently taking dosage of 0.25 mg at night which is working very well.  No breakthrough symptoms  History of GERD.  Currently controlled with omeprazole 40 mg daily.  No dysphagia.  Appetite and weight stable.  Patient had physical last spring.  He had mildly elevated liver transaminases.  Suspected fatty liver changes.  No regular alcohol use.   ROS: See pertinent positives and negatives per HPI.  No past medical history on file.  Past Surgical History:  Procedure Laterality Date  . NOSE SURGERY      Family History  Problem Relation Age of Onset  . Hyperlipidemia Maternal Grandfather   . Diabetes Paternal Grandmother     SOCIAL HX: Non-smoker   Current Outpatient Medications:  .  fluconazole (DIFLUCAN) 150 MG tablet, Take two tablets po times one dose and repeat two tablets in one week (prn Tinea Versicolor), Disp: 8 tablet, Rfl: 2 .  omeprazole (PRILOSEC) 40 MG capsule, Take 1 capsule (40 mg total) by mouth daily., Disp: 90 capsule, Rfl: 3 .  pramipexole (MIRAPEX) 0.25 MG tablet, Take 1 tablet (0.25 mg total) by mouth at bedtime., Disp: 90 tablet, Rfl: 3  EXAM:  VITALS per patient if applicable:  GENERAL: alert, oriented, appears well and in no acute distress  HEENT: atraumatic, conjunttiva clear, no obvious abnormalities on  inspection of external nose and ears  NECK: normal movements of the head and neck  LUNGS: on inspection no signs of respiratory distress, breathing rate appears normal, no obvious gross SOB, gasping or wheezing  CV: no obvious cyanosis  MS: moves all visible extremities without noticeable abnormality  PSYCH/NEURO: pleasant and cooperative, no obvious depression or anxiety, speech and thought processing grossly intact  ASSESSMENT AND PLAN:  Discussed the following assessment and plan:  #1 restless leg syndrome-controlled on Mirapex  -Refilled Mirapex 0.25 mg 1 nightly for 1 year  #2 history of GERD.  Stable on omeprazole 40 mg daily  -Refill omeprazole for 1 year  #3 history of liver transaminase elevations.  Suspect related to fatty liver changes  -Recommend weight loss and decrease saturated fats.  Needs follow-up liver panel but will schedule physical after current COVID-19 pandemic has settled down     I discussed the assessment and treatment plan with the patient. The patient was provided an opportunity to ask questions and all were answered. The patient agreed with the plan and demonstrated an understanding of the instructions.   The patient was advised to call back or seek an in-person evaluation if the symptoms worsen or if the condition fails to improve as anticipated.  I provided 15 minutes of non-face-to-face time during this encounter.   Evelena Peat, MD

## 2018-10-06 ENCOUNTER — Other Ambulatory Visit: Payer: Self-pay | Admitting: Family Medicine

## 2018-12-13 ENCOUNTER — Other Ambulatory Visit: Payer: Self-pay | Admitting: Family Medicine

## 2018-12-16 NOTE — Telephone Encounter (Signed)
OK to continue this medication? 

## 2018-12-17 NOTE — Telephone Encounter (Signed)
Refill once.   He takes for Tinea Versicolor flare ups.

## 2019-07-11 ENCOUNTER — Other Ambulatory Visit: Payer: Self-pay | Admitting: Family Medicine

## 2019-09-30 ENCOUNTER — Other Ambulatory Visit: Payer: Self-pay | Admitting: Family Medicine

## 2019-10-16 ENCOUNTER — Ambulatory Visit: Payer: 59 | Attending: Internal Medicine

## 2019-10-16 DIAGNOSIS — Z23 Encounter for immunization: Secondary | ICD-10-CM

## 2019-10-16 NOTE — Progress Notes (Signed)
   Covid-19 Vaccination Clinic  Name:  KEGAN MCKEITHAN    MRN: [id number] DOB: 31-Mar-1991  10/16/2019  Mr. Detamore was observed post Covid-19 immunization for 15 minutes without incident. He was provided with Vaccine Information Sheet and instruction to access the V-Safe system.   Mr. Odoherty was instructed to call 911 with any severe reactions post vaccine: Marland Kitchen Difficulty breathing  . Swelling of face and throat  . A fast heartbeat  . A bad rash all over body  . Dizziness and weakness   Immunizations Administered    Name Date Dose VIS Date Route   Pfizer COVID-19 Vaccine 10/16/2019  8:27 AM 0.3 mL 06/13/2019 Intramuscular   Manufacturer: [employer], [employer]   Lot: [id number]   NDC: [address]-8

## 2019-11-11 ENCOUNTER — Ambulatory Visit: Payer: 59 | Attending: Internal Medicine

## 2019-11-11 DIAGNOSIS — Z23 Encounter for immunization: Secondary | ICD-10-CM

## 2019-11-11 NOTE — Progress Notes (Signed)
   Covid-19 Vaccination Clinic  Name:  Hector Navarro    MRN: [id number] DOB: 1991/05/26  11/11/2019  [name] was observed post Covid-19 immunization for 15 minutes without incident. He was provided with Vaccine Information Sheet and instruction to access the V-Safe system.   [name] was instructed to call 911 with any severe reactions post vaccine: Marland Kitchen Difficulty breathing  . Swelling of face and throat  . A fast heartbeat  . A bad rash all over body  . Dizziness and weakness   Immunizations Administered    Name Date Dose VIS Date Route   Pfizer COVID-19 Vaccine 11/11/2019  8:20 AM 0.3 mL 08/27/2018 Intramuscular   Manufacturer: [employer], [employer]   Lot: [id number]   NDC: [address]-0

## 2020-05-11 ENCOUNTER — Other Ambulatory Visit: Payer: Self-pay | Admitting: Family Medicine

## 2020-05-11 MED ORDER — OMEPRAZOLE 40 MG PO CPDR: 40.0000 mg | DELAYED_RELEASE_CAPSULE | Freq: Every day | ORAL | 3 refills | Status: DC

## 2020-05-11 MED ORDER — PRAMIPEXOLE DIHYDROCHLORIDE 0.25 MG PO TABS: 0.2500 mg | ORAL_TABLET | Freq: Every day | ORAL | 3 refills | Status: DC

## 2020-05-11 NOTE — Telephone Encounter (Signed)
Rx's sent in. °

## 2020-05-11 NOTE — Telephone Encounter (Signed)
Pt is calling in stating that his medication that was mail ordered has gotten lost in the mail and would like to know if Dr. Caryl Never will refill them again and send them to the local pharmacy.  Rx's omeprazole (PRILOSEC) 40 MG and pramipexole (MIRAPEX) 0.125 MG   Ph[facility]:  [facility] [address]

## 2020-09-23 ENCOUNTER — Other Ambulatory Visit: Payer: Self-pay | Admitting: Family Medicine

## 2020-10-09 ENCOUNTER — Telehealth: Payer: Self-pay | Admitting: Family Medicine

## 2020-11-01 NOTE — Telephone Encounter (Signed)
  omeprazole (PRILOSEC) 40 MG capsule  pramipexole (MIRAPEX) 0.25 MG tablet   [address] - [place], [place] - [address] Phone:  [phone]  Fax:  [phone]

## 2020-11-01 NOTE — Telephone Encounter (Signed)
Patient has not been seen in 2 years. He will need an office visit before any medications can be sent in.   Left message for patient to call back.

## 2020-11-02 NOTE — Telephone Encounter (Signed)
Left message for patient to call back  

## 2020-11-04 NOTE — Telephone Encounter (Signed)
Left message for patient to call back. Unable to reach the patient. Message will be closed.  

## 2021-05-23 ENCOUNTER — Other Ambulatory Visit: Payer: Self-pay | Admitting: Family Medicine

## 2021-06-20 ENCOUNTER — Other Ambulatory Visit: Payer: Self-pay | Admitting: Family Medicine

## 2021-06-28 ENCOUNTER — Encounter: Payer: 59 | Admitting: Family Medicine

## 2021-07-01 ENCOUNTER — Ambulatory Visit (INDEPENDENT_AMBULATORY_CARE_PROVIDER_SITE_OTHER): Payer: 59 | Admitting: Family Medicine

## 2021-07-01 ENCOUNTER — Encounter: Payer: Self-pay | Admitting: Family Medicine

## 2021-07-01 VITALS — BP 138/88 | HR 104 | Temp 97.8°F | Ht 70.5 in | Wt 246.9 lb

## 2021-07-01 DIAGNOSIS — Z0000 Encounter for general adult medical examination without abnormal findings: Secondary | ICD-10-CM

## 2021-07-01 LAB — LIPID PANEL
Cholesterol: 203 mg/dL — ABNORMAL HIGH (ref 0–200)
HDL: 45.5 mg/dL (ref >39.00)
NonHDL: 157.84
Total CHOL/HDL Ratio: 4
Triglycerides: 269 mg/dL — ABNORMAL HIGH (ref 0.0–149.0)
VLDL: 53.8 mg/dL — ABNORMAL HIGH (ref 0.0–40.0)

## 2021-07-01 LAB — CBC WITH DIFFERENTIAL/PLATELET
Basophils Absolute: 0.1 10*3/uL (ref 0.0–0.1)
Basophils Relative: 1 % (ref 0.0–3.0)
Eosinophils Absolute: 0.1 10*3/uL (ref 0.0–0.7)
Eosinophils Relative: 1.2 % (ref 0.0–5.0)
HCT: 46.5 % (ref 39.0–52.0)
Hemoglobin: 15.8 g/dL (ref 13.0–17.0)
Lymphocytes Relative: 30.9 % (ref 12.0–46.0)
Lymphs Abs: 2.3 10*3/uL (ref 0.7–4.0)
MCHC: 33.9 g/dL (ref 30.0–36.0)
MCV: 85.7 fl (ref 78.0–100.0)
Monocytes Absolute: 0.6 10*3/uL (ref 0.1–1.0)
Monocytes Relative: 8.6 % (ref 3.0–12.0)
Neutro Abs: 4.3 10*3/uL (ref 1.4–7.7)
Neutrophils Relative %: 58.3 % (ref 43.0–77.0)
Platelets: 271 10*3/uL (ref 150.0–400.0)
RBC: 5.43 Mil/uL (ref 4.22–5.81)
RDW: 14.4 % (ref 11.5–15.5)
WBC: 7.4 10*3/uL (ref 4.0–10.5)

## 2021-07-01 LAB — BASIC METABOLIC PANEL
BUN: 13 mg/dL (ref 6–23)
CO2: 29 mEq/L (ref 19–32)
Calcium: 9.9 mg/dL (ref 8.4–10.5)
Chloride: 104 mEq/L (ref 96–112)
Creatinine, Ser: 1.18 mg/dL (ref 0.40–1.50)
GFR: 83.37 mL/min (ref >60.00)
Glucose, Bld: 89 mg/dL (ref 70–99)
Potassium: 4.1 mEq/L (ref 3.5–5.1)
Sodium: 140 mEq/L (ref 135–145)

## 2021-07-01 LAB — HEPATIC FUNCTION PANEL
ALT: 68 U/L — ABNORMAL HIGH (ref 0–53)
AST: 33 U/L (ref 0–37)
Albumin: 4.7 g/dL (ref 3.5–5.2)
Alkaline Phosphatase: 47 U/L (ref 39–117)
Bilirubin, Direct: 0.1 mg/dL (ref 0.0–0.3)
Total Bilirubin: 0.6 mg/dL (ref 0.2–1.2)
Total Protein: 7.6 g/dL (ref 6.0–8.3)

## 2021-07-01 LAB — LDL CHOLESTEROL, DIRECT: Direct LDL: 152 mg/dL

## 2021-07-01 MED ORDER — PRAMIPEXOLE DIHYDROCHLORIDE 0.25 MG PO TABS: 0.2500 mg | ORAL_TABLET | Freq: Every day | ORAL | 3 refills | Status: DC

## 2021-07-01 MED ORDER — OMEPRAZOLE 40 MG PO CPDR: 40.0000 mg | DELAYED_RELEASE_CAPSULE | Freq: Every day | ORAL | 3 refills | Status: DC

## 2021-07-01 NOTE — Patient Instructions (Signed)
Monitor blood pressure and be in touch if consistently > 140/90.   

## 2021-07-01 NOTE — Progress Notes (Signed)
Established Patient Office Visit  Subjective:  Patient ID: Hector Navarro, male    DOB: Sep 07, 1990  Age: 30 y.o. MRN: [id number]  CC:  Chief Complaint  Patient presents with   Annual Exam    HPI Hector Navarro presents for physical exam.  He has history of GERD which is controlled with omeprazole.  History of restless leg syndrome treated with Mirapex.  Requesting refills of both medications.  Has had mildly elevated liver transaminase in the past.  No history of alcohol abuse.  No specific risk factors for hepatitis C screening.  Health maintenance reviewed  -Tetanus due 2026 -Flu vaccine already given -No history of hepatitis C screening but low risk  Family history-mother has history of bipolar disorder.  He has a brother and sister had depression history.  No family history of type 2 diabetes or premature CAD or any other cancers  Social history-married.  Has 3 children.  He has 77-year-old son, 101-year-old daughter, and 77-year-old son.  Non-smoker.  No regular alcohol use.  No past medical history on file.  Past Surgical History:  Procedure Laterality Date   NOSE SURGERY      Family History  Problem Relation Age of Onset   Bipolar disorder Mother    Depression Sister    Depression Brother    Hyperlipidemia Maternal Grandfather    Diabetes Paternal Grandmother     Social History   Socioeconomic History   Marital status: Single    Spouse name: Not on file   Number of children: Not on file   Years of education: Not on file   Highest education level: Not on file  Occupational History   Not on file  Tobacco Use   Smoking status: Never   Smokeless tobacco: Never  Substance and Sexual Activity   Alcohol use: No   Drug use: No   Sexual activity: Not on file  Other Topics Concern   Not on file  Social History Narrative   Not on file   Social Determinants of Health   Financial Resource Strain: Not on file  Food Insecurity: Not on file  Transportation  Needs: Not on file  Physical Activity: Not on file  Stress: Not on file  Social Connections: Not on file  Intimate Partner Violence: Not on file    Outpatient Medications Prior to Visit  Medication Sig Dispense Refill   fluconazole (DIFLUCAN) 150 MG tablet TAKE 2 TABLETS BY MOUTH AS ONE DOSE AND REPEAT 2 TABLETS IN 1 WEEK AS NEEDED 8 tablet 1   omeprazole (PRILOSEC) 40 MG capsule TAKE 1 CAPSULE BY MOUTH DAILY 30 capsule 2   pramipexole (MIRAPEX) 0.25 MG tablet Take 1 tablet (0.25 mg total) by mouth at bedtime. 30 tablet 0   No facility-administered medications prior to visit.    No Known Allergies  ROS Review of Systems  Constitutional:  Negative for activity change, appetite change, fatigue, fever and unexpected weight change.  HENT:  Negative for congestion, ear pain and trouble swallowing.   Eyes:  Negative for pain and visual disturbance.  Respiratory:  Negative for cough, shortness of breath and wheezing.   Cardiovascular:  Negative for chest pain and palpitations.  Gastrointestinal:  Negative for abdominal distention, abdominal pain, blood in stool, constipation, diarrhea, nausea, rectal pain and vomiting.  Endocrine: Negative for polydipsia and polyuria.  Genitourinary:  Negative for dysuria, hematuria and testicular pain.  Musculoskeletal:  Negative for arthralgias and joint swelling.  Skin:  Negative for rash.  Neurological:  Negative for dizziness, syncope and headaches.  Hematological:  Negative for adenopathy.  Psychiatric/Behavioral:  Negative for confusion and dysphoric mood.      Objective:    Physical Exam Constitutional:      General: He is not in acute distress.    Appearance: He is well-developed.  HENT:     Head: Normocephalic and atraumatic.     Right Ear: External ear normal.     Left Ear: External ear normal.  Eyes:     Conjunctiva/sclera: Conjunctivae normal.     Pupils: Pupils are equal, round, and reactive to light.  Neck:     Thyroid: No  thyromegaly.  Cardiovascular:     Rate and Rhythm: Normal rate and regular rhythm.     Heart sounds: Normal heart sounds. No murmur heard. Pulmonary:     Effort: No respiratory distress.     Breath sounds: No wheezing or rales.  Abdominal:     General: Bowel sounds are normal. There is no distension.     Palpations: Abdomen is soft. There is no mass.     Tenderness: There is no abdominal tenderness. There is no guarding or rebound.  Musculoskeletal:     Cervical back: Normal range of motion and neck supple.     Right lower leg: No edema.     Left lower leg: No edema.  Lymphadenopathy:     Cervical: No cervical adenopathy.  Skin:    Findings: No rash.  Neurological:     Mental Status: He is alert and oriented to person, place, and time.     Cranial Nerves: No cranial nerve deficit.     Deep Tendon Reflexes: Reflexes normal.    BP 138/88 (BP Location: Left Arm, Cuff Size: Normal)    Pulse (!) 104    Temp 97.8 F (36.6 C) (Oral)    Ht 5' 10.5" (1.791 m)    Wt 246 lb 14.4 oz (112 kg)    SpO2 99%    BMI 34.93 kg/m  Wt Readings from Last 3 Encounters:  07/01/21 246 lb 14.4 oz (112 kg)  09/05/17 230 lb (104.3 kg)  08/29/16 218 lb 14.4 oz (99.3 kg)     Health Maintenance Due  Topic Date Due   Hepatitis C Screening  Never done   COVID-19 Vaccine (3 - Booster for Pfizer series) 01/06/2020    There are no preventive care reminders to display for this patient.  Lab Results  Component Value Date   TSH 2.48 09/05/2017   Lab Results  Component Value Date   WBC 6.3 09/05/2017   HGB 16.4 09/05/2017   HCT 46.5 09/05/2017   MCV 84.8 09/05/2017   PLT 260.0 09/05/2017   Lab Results  Component Value Date   NA 142 09/05/2017   K 4.3 09/05/2017   CO2 28 09/05/2017   GLUCOSE 91 09/05/2017   BUN 14 09/05/2017   CREATININE 1.08 09/05/2017   BILITOT 0.6 12/17/2017   ALKPHOS 47 12/17/2017   AST 41 (H) 12/17/2017   ALT 77 (H) 12/17/2017   PROT 7.1 12/17/2017   ALBUMIN 4.9  12/17/2017   CALCIUM 10.2 09/05/2017   GFR 88.03 09/05/2017   Lab Results  Component Value Date   CHOL 159 09/05/2017   Lab Results  Component Value Date   HDL 37.40 (L) 09/05/2017   Lab Results  Component Value Date   LDLCALC 101 (H) 09/05/2017   Lab Results  Component Value Date   TRIG 105.0 09/05/2017  Lab Results  Component Value Date   CHOLHDL 4 09/05/2017   No results found for: HGBA1C    Assessment & Plan:   Problem List Items Addressed This Visit   None Visit Diagnoses     Physical exam    -  Primary   Relevant Orders   Basic metabolic panel   Hepatic function panel   Lipid panel   CBC with Differential/Platelet   Hep C Antibody     Patient has had some steady weight gain in recent years.  Initial blood pressure was 160/100 but substantially improved at 138/88 at repeat.  We strongly advocated he try to lose some weight and establish more consistent exercise.  If possible, home blood pressure monitoring periodically and be in touch if consistently greater than 140/90  -Obtain screening labs as above. -He does have history of mild liver transaminase elevations.  If these remain elevated consider abdominal ultrasound and further evaluation to rule out things like hemochromatosis. -Handout on DASH diet given -Refilled regular medications for 1 year  Meds ordered this encounter  Medications   omeprazole (PRILOSEC) 40 MG capsule    Sig: Take 1 capsule (40 mg total) by mouth daily.    Dispense:  90 capsule    Refill:  3   pramipexole (MIRAPEX) 0.25 MG tablet    Sig: Take 1 tablet (0.25 mg total) by mouth at bedtime.    Dispense:  90 tablet    Refill:  3    Follow-up: No follow-ups on file.    Evelena Peat, MD

## 2021-07-04 LAB — HEPATITIS C ANTIBODY
Hepatitis C Ab: NONREACTIVE
SIGNAL TO CUT-OFF: 0.02 (ref <1.00)

## 2021-07-05 ENCOUNTER — Other Ambulatory Visit: Payer: Self-pay

## 2021-07-05 DIAGNOSIS — R7401 Elevation of levels of liver transaminase levels: Secondary | ICD-10-CM

## 2021-10-17 ENCOUNTER — Telehealth (INDEPENDENT_AMBULATORY_CARE_PROVIDER_SITE_OTHER): Payer: 59 | Admitting: Family Medicine

## 2021-10-17 ENCOUNTER — Encounter: Payer: Self-pay | Admitting: Family Medicine

## 2021-10-17 ENCOUNTER — Telehealth: Payer: Self-pay | Admitting: Family Medicine

## 2021-10-17 VITALS — Ht 70.5 in | Wt 246.9 lb

## 2021-10-17 DIAGNOSIS — Z3009 Encounter for other general counseling and advice on contraception: Secondary | ICD-10-CM

## 2021-10-17 NOTE — Telephone Encounter (Signed)
Pt has OV scheduled for today and has had visit with PCP ?

## 2021-10-17 NOTE — Telephone Encounter (Signed)
Patient returned call to [facility]. ?

## 2021-10-17 NOTE — Progress Notes (Signed)
Patient ID: Hector Navarro, male   DOB: 1991-03-04, 31 y.o.   MRN: [id number] ? ?This visit type was conducted due to national recommendations for restrictions regarding the COVID-19 pandemic in an effort to limit this patient's exposure and mitigate transmission in our community.  ? ?Virtual Visit via Video Note ? ?I connected with Hector Navarro on 10/17/21 at  1:45 PM EDT by a video enabled telemedicine application and verified that I am speaking with the correct person using two identifiers. ? Location patient: home ?Location provider:work or home office ?Persons participating in the virtual visit: patient, provider ? ?I discussed the limitations of evaluation and management by telemedicine and the availability of in person appointments. The patient expressed understanding and agreed to proceed. ? ? ?HPI: ? ?Hector Navarro called basically requesting consultation regarding possible vasectomy.  He and his wife have 3 children ages 5, 55, 1-1/2.  They are certain that they do not wish to have any further children.  His wife is currently taking oral contraception but has had increased side effects over time.  They would like to proceed soon as possible with consultation for vasectomy ? ?TM has history of GERD and restless leg syndrome and those are fairly well controlled with omeprazole and Mirapex.  No other significant chronic medical problems. ? ? ?ROS: See pertinent positives and negatives per HPI. ? ?History reviewed. No pertinent past medical history. ? ?Past Surgical History:  ?Procedure Laterality Date  ? NOSE SURGERY    ? ? ?Family History  ?Problem Relation Age of Onset  ? Bipolar disorder Mother   ? Depression Sister   ? Depression Brother   ? Hyperlipidemia Maternal Grandfather   ? Diabetes Paternal Grandmother   ? ? ?SOCIAL HX: Married with 3 children as above. ? ? ?Current Outpatient Medications:  ?  fluconazole (DIFLUCAN) 150 MG tablet, TAKE 2 TABLETS BY MOUTH AS ONE DOSE AND REPEAT 2 TABLETS IN 1 WEEK AS NEEDED,  Disp: 8 tablet, Rfl: 1 ?  omeprazole (PRILOSEC) 40 MG capsule, Take 1 capsule (40 mg total) by mouth daily., Disp: 90 capsule, Rfl: 3 ?  pramipexole (MIRAPEX) 0.25 MG tablet, Take 1 tablet (0.25 mg total) by mouth at bedtime., Disp: 90 tablet, Rfl: 3 ? ?EXAM: ? ?VITALS per patient if applicable: ? ?GENERAL: alert, oriented, appears well and in no acute distress ? ?HEENT: atraumatic, conjunttiva clear, no obvious abnormalities on inspection of external nose and ears ? ?NECK: normal movements of the head and neck ? ?LUNGS: on inspection no signs of respiratory distress, breathing rate appears normal, no obvious gross SOB, gasping or wheezing ? ?CV: no obvious cyanosis ? ?MS: moves all visible extremities without noticeable abnormality ? ?PSYCH/NEURO: pleasant and cooperative, no obvious depression or anxiety, speech and thought processing grossly intact ? ?ASSESSMENT AND PLAN: ? ?Discussed the following assessment and plan: ? ?Consultation for sterilization - Plan: Ambulatory referral to Urology ? ?-Set up referral to urologist for vasectomy consultation. ? ?  ?I discussed the assessment and treatment plan with the patient. The patient was provided an opportunity to ask questions and all were answered. The patient agreed with the plan and demonstrated an understanding of the instructions. ?  ?The patient was advised to call back or seek an in-person evaluation if the symptoms worsen or if the condition fails to improve as anticipated. ? ? ? ? ?Evelena Peat, MD  ? ?

## 2021-11-17 ENCOUNTER — Encounter: Payer: Self-pay | Admitting: Family Medicine

## 2021-11-18 ENCOUNTER — Other Ambulatory Visit: Payer: Self-pay

## 2021-11-18 MED ORDER — FLUCONAZOLE 150 MG PO TABS: ORAL_TABLET | ORAL | 1 refills | Status: DC

## 2021-11-30 ENCOUNTER — Ambulatory Visit (INDEPENDENT_AMBULATORY_CARE_PROVIDER_SITE_OTHER): Payer: 59

## 2021-11-30 ENCOUNTER — Ambulatory Visit
Admission: RE | Admit: 2021-11-30 | Discharge: 2021-11-30 | Disposition: A | Discharge status: Home / Self Care | Payer: 59 | Source: Ambulatory Visit | Attending: Urgent Care | Admitting: Urgent Care

## 2021-11-30 VITALS — BP 125/85 | HR 100 | Temp 98.4°F | Resp 18

## 2021-11-30 DIAGNOSIS — S59901A Unspecified injury of right elbow, initial encounter: Secondary | ICD-10-CM

## 2021-11-30 DIAGNOSIS — M25521 Pain in right elbow: Secondary | ICD-10-CM

## 2021-11-30 DIAGNOSIS — M79631 Pain in right forearm: Secondary | ICD-10-CM | POA: Diagnosis not present

## 2021-11-30 DIAGNOSIS — W19XXXA Unspecified fall, initial encounter: Secondary | ICD-10-CM

## 2021-11-30 DIAGNOSIS — S59909A Unspecified injury of unspecified elbow, initial encounter: Secondary | ICD-10-CM

## 2021-11-30 MED ORDER — NAPROXEN SODIUM 550 MG PO TABS: 550.0000 mg | ORAL_TABLET | Freq: Two times a day (BID) | ORAL | 0 refills | Status: AC
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
## 2021-11-30 NOTE — Discharge Instructions (Signed)
Your x-ray shows a positive sail sign, which indicates an effusion or fluid in your elbow.  This can sometimes also indicate an occult (hidden) fracture. Please call orthopedics this afternoon to see if you can get a follow-up in the next 2 days. Please wear the sling until seen by Ortho. Avoid excessive use of your arm.  Use the naproxen twice daily with food to avoid an upset stomach. Ice the area. If any numbness or tingling occurs down your arm, head to ER
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
## 2021-12-27 ENCOUNTER — Encounter: Payer: Self-pay | Admitting: Family Medicine

## 2022-05-19 ENCOUNTER — Ambulatory Visit (LOCAL_COMMUNITY_HEALTH_CENTER): Payer: Self-pay

## 2022-05-19 DIAGNOSIS — Z719 Counseling, unspecified: Secondary | ICD-10-CM

## 2022-05-19 DIAGNOSIS — Z23 Encounter for immunization: Secondary | ICD-10-CM

## 2022-05-19 NOTE — Progress Notes (Signed)
  Are you feeling sick today? No   Have you ever received a dose of COVID-19 Vaccine? [employer], [place], [place], [place], Other) Yes  If yes, which vaccine and how many doses?    Pfizer  4  Did you bring the vaccination record card or other documentation?  No   Do you have a health condition or are undergoing treatment that makes you moderately or severely immunocompromised? This would include, but not be limited to: cancer, HIV, organ transplant, immunosuppressive therapy/high-dose corticosteroids, or moderate/severe primary immunodeficiency.  No  Have you received COVID-19 vaccine before or during hematopoietic cell transplant (HCT) or CAR-T-cell therapies? No  Have you ever had an allergic reaction to: (This would include a severe allergic reaction or a reaction that caused hives, swelling, or respiratory distress, including wheezing.) A component of a COVID-19 vaccine or a previous dose of COVID-19 vaccine? No   Have you ever had an allergic reaction to another vaccine (other thanCOVID-19 vaccine) or an injectable medication? (This would include a severe allergic reaction or a reaction that caused hives, swelling, or respiratory distress, including wheezing.)   No    Do you have a history of any of the following:  Myocarditis or Pericarditis No  Dermal fillers:  No  Multisystem Inflammatory Syndrome (MIS-C or MIS-A)? No  COVID-19 disease within the past 3 months? No  Vaccinated with monkeypox vaccine in the last 4 weeks? No    Vaccine administered per Bridge program criteria.   Copy of NCIR Provided.  After vaccine care reviewed.

## 2022-06-23 ENCOUNTER — Other Ambulatory Visit: Payer: Self-pay | Admitting: Family Medicine

## 2022-06-26 NOTE — Telephone Encounter (Signed)
Last CPE 07/01/21; no future OV.

## 2022-07-10 ENCOUNTER — Ambulatory Visit (INDEPENDENT_AMBULATORY_CARE_PROVIDER_SITE_OTHER): Payer: Self-pay | Admitting: Family Medicine

## 2022-07-10 ENCOUNTER — Encounter: Payer: Self-pay | Admitting: Family Medicine

## 2022-07-10 VITALS — BP 122/88 | HR 76 | Temp 98.0°F | Ht 70.87 in | Wt 238.7 lb

## 2022-07-10 DIAGNOSIS — Z0000 Encounter for general adult medical examination without abnormal findings: Secondary | ICD-10-CM

## 2022-07-10 LAB — BASIC METABOLIC PANEL
BUN: 15 mg/dL (ref 6–23)
CO2: 28 mEq/L (ref 19–32)
Calcium: 9.9 mg/dL (ref 8.4–10.5)
Chloride: 103 mEq/L (ref 96–112)
Creatinine, Ser: 1.07 mg/dL (ref 0.40–1.50)
GFR: 93.08 mL/min (ref >60.00)
Glucose, Bld: 94 mg/dL (ref 70–99)
Potassium: 4.1 mEq/L (ref 3.5–5.1)
Sodium: 140 mEq/L (ref 135–145)

## 2022-07-10 LAB — CBC WITH DIFFERENTIAL/PLATELET
Basophils Absolute: 0.1 10*3/uL (ref 0.0–0.1)
Basophils Relative: 0.9 % (ref 0.0–3.0)
Eosinophils Absolute: 0.1 10*3/uL (ref 0.0–0.7)
Eosinophils Relative: 1.2 % (ref 0.0–5.0)
HCT: 45.7 % (ref 39.0–52.0)
Hemoglobin: 15.8 g/dL (ref 13.0–17.0)
Lymphocytes Relative: 34.2 % (ref 12.0–46.0)
Lymphs Abs: 2.2 10*3/uL (ref 0.7–4.0)
MCHC: 34.5 g/dL (ref 30.0–36.0)
MCV: 85.9 fl (ref 78.0–100.0)
Monocytes Absolute: 0.5 10*3/uL (ref 0.1–1.0)
Monocytes Relative: 7.8 % (ref 3.0–12.0)
Neutro Abs: 3.6 10*3/uL (ref 1.4–7.7)
Neutrophils Relative %: 55.9 % (ref 43.0–77.0)
Platelets: 249 10*3/uL (ref 150.0–400.0)
RBC: 5.32 Mil/uL (ref 4.22–5.81)
RDW: 14.6 % (ref 11.5–15.5)
WBC: 6.5 10*3/uL (ref 4.0–10.5)

## 2022-07-10 LAB — LIPID PANEL
Cholesterol: 213 mg/dL — ABNORMAL HIGH (ref 0–200)
HDL: 47.9 mg/dL (ref >39.00)
LDL Cholesterol: 132 mg/dL — ABNORMAL HIGH (ref 0–99)
NonHDL: 164.78
Total CHOL/HDL Ratio: 4
Triglycerides: 162 mg/dL — ABNORMAL HIGH (ref 0.0–149.0)
VLDL: 32.4 mg/dL (ref 0.0–40.0)

## 2022-07-10 LAB — HEPATIC FUNCTION PANEL
ALT: 49 U/L (ref 0–53)
AST: 31 U/L (ref 0–37)
Albumin: 4.9 g/dL (ref 3.5–5.2)
Alkaline Phosphatase: 42 U/L (ref 39–117)
Bilirubin, Direct: 0.1 mg/dL (ref 0.0–0.3)
Total Bilirubin: 0.7 mg/dL (ref 0.2–1.2)
Total Protein: 8 g/dL (ref 6.0–8.3)

## 2022-07-10 LAB — TSH: TSH: 1.69 u[IU]/mL (ref 0.35–5.50)

## 2022-07-10 MED ORDER — PRAMIPEXOLE DIHYDROCHLORIDE 0.25 MG PO TABS: 0.2500 mg | ORAL_TABLET | Freq: Every day | ORAL | 3 refills | Status: DC

## 2022-07-10 MED ORDER — OMEPRAZOLE 40 MG PO CPDR: 40.0000 mg | DELAYED_RELEASE_CAPSULE | Freq: Every day | ORAL | 3 refills | Status: DC

## 2022-07-10 NOTE — Progress Notes (Signed)
Established Patient Office Visit  Subjective   Patient ID: EKANSH SHERK, male    DOB: Jun 24, 1991  Age: 32 y.o. MRN: [id number]  Chief Complaint  Patient presents with   Annual Exam    HPI   Octavia Bruckner is seen today for physical exam.  He and his wife have 3 children.  [name] had vasectomy within the past year.  Just recently took on a new job with a [employer] and is working remotely from home.  He had lost down to 225 pounds but recently gained some weight back.  Hopes to lose this soon.  He has history of restless legs controlled with Mirapex and takes omeprazole 40 mg once daily for reflux which is currently controlled.  Health maintenance reviewed  -Flu vaccine already given -Tetanus up-to-date -Prior hepatitis C antibody negative  Social history-married with 3 children ages 87, 10, 2.  Non-smoker.  Recently took on a new job with [employer] has history of bipolar disorder.  He has a brother and sister who have had depression history.  No family history of type 2 diabetes or premature CAD or any other cancer   History reviewed. No pertinent past medical history. Past Surgical History:  Procedure Laterality Date   NOSE SURGERY      reports that he has never smoked. He has never used smokeless tobacco. He reports that he does not drink alcohol and does not use drugs. family history includes Bipolar disorder in his mother; Depression in his brother and sister; Diabetes in his paternal grandmother; Hyperlipidemia in his maternal grandfather. No Known Allergies   Review of Systems  Constitutional:  Negative for chills, fever, malaise/fatigue and weight loss.  HENT:  Negative for hearing loss.   Eyes:  Negative for blurred vision and double vision.  Respiratory:  Negative for cough and shortness of breath.   Cardiovascular:  Negative for chest pain, palpitations and leg swelling.  Gastrointestinal:  Negative for abdominal pain,  blood in stool, constipation and diarrhea.  Genitourinary:  Negative for dysuria.  Skin:  Negative for rash.  Neurological:  Negative for dizziness, speech change, seizures, loss of consciousness and headaches.  Psychiatric/Behavioral:  Negative for depression.       Objective:     BP 122/88 (BP Location: Left Arm, Cuff Size: Normal)   Pulse 76   Temp 98 F (36.7 C) (Oral)   Ht 5' 10.87" (1.8 m)   Wt 238 lb 11.2 oz (108.3 kg)   SpO2 98%   BMI 33.42 kg/m  BP Readings from Last 3 Encounters:  07/10/22 122/88  11/30/21 125/85  07/01/21 138/88   Wt Readings from Last 3 Encounters:  07/10/22 238 lb 11.2 oz (108.3 kg)  10/17/21 246 lb 14.4 oz (112 kg)  07/01/21 246 lb 14.4 oz (112 kg)      Physical Exam Vitals reviewed.  Constitutional:      General: He is not in acute distress.    Appearance: He is well-developed. He is not ill-appearing.  HENT:     Head: Normocephalic and atraumatic.     Right Ear: External ear normal.     Left Ear: External ear normal.  Eyes:     Conjunctiva/sclera: Conjunctivae normal.     Pupils: Pupils are equal, round, and reactive to light.  Neck:     Thyroid: No thyromegaly.  Cardiovascular:     Rate and Rhythm: Normal rate and regular rhythm.     Heart sounds: Normal heart  sounds. No murmur heard. Pulmonary:     Effort: No respiratory distress.     Breath sounds: No wheezing or rales.  Abdominal:     General: Bowel sounds are normal. There is no distension.     Palpations: Abdomen is soft. There is no mass.     Tenderness: There is no abdominal tenderness. There is no guarding or rebound.  Musculoskeletal:     Cervical back: Normal range of motion and neck supple.  Lymphadenopathy:     Cervical: No cervical adenopathy.  Skin:    Findings: No rash.  Neurological:     Mental Status: He is alert and oriented to person, place, and time.     Cranial Nerves: No cranial nerve deficit.      No results found for any visits on  07/10/22.    The ASCVD Risk score (Arnett DK, et al., 2019) failed to calculate for the following reasons:   The 2019 ASCVD risk score is only valid for ages 56 to 83    Assessment & Plan:   Problem List Items Addressed This Visit   None Visit Diagnoses     Physical exam    -  Primary   Relevant Orders   Basic metabolic panel   Lipid panel   CBC with Differential/Platelet   TSH   Hepatic function panel     Generally healthy 32 year old male.  Initial blood pressure up but did come down some with rest.  We recommend try to drop some weight and establish more consistent exercise.  Obtain screening labs.  History of mildly elevated ALT liver enzyme with suspected fatty liver changes.  This should hopefully improve with weight loss -Refilled Mirapex and omeprazole  No follow-ups on file.    Evelena Peat, MD

## 2022-07-10 NOTE — Patient Instructions (Signed)
Try to lose some weight  Try to establish more consistent exercise  Monitor blood pressure and be in touch if consistently > 140 (top number) or > 90 (bottom number)

## 2023-02-07 ENCOUNTER — Telehealth: Payer: Self-pay | Admitting: Family Medicine

## 2023-02-07 MED ORDER — PRAMIPEXOLE DIHYDROCHLORIDE 0.25 MG PO TABS: 0.2500 mg | ORAL_TABLET | Freq: Every day | ORAL | 0 refills | Status: DC

## 2023-02-07 MED ORDER — OMEPRAZOLE 40 MG PO CPDR: 40.0000 mg | DELAYED_RELEASE_CAPSULE | Freq: Every day | ORAL | 0 refills | Status: DC

## 2023-02-07 NOTE — Telephone Encounter (Signed)
Left message on machine for patient.  Rx sent.

## 2023-02-07 NOTE — Telephone Encounter (Signed)
Pt left home on a trip, realized he forgot to bring his meds at home. Requesting emergency fill of omeprazole (PRILOSEC) 40 MG capsule  pramipexole (MIRAPEX) 0.25 MG tablet sent to [facility] [address] [place]  phone [phone]

## 2023-02-07 NOTE — Addendum Note (Signed)
Addended by: Kern Reap B on: 02/07/2023 01:12 PM   Modules accepted: Orders

## 2023-07-24 ENCOUNTER — Encounter: Payer: Self-pay | Admitting: Family Medicine

## 2023-08-01 ENCOUNTER — Encounter: Payer: Self-pay | Admitting: Family Medicine

## 2023-08-01 ENCOUNTER — Ambulatory Visit (INDEPENDENT_AMBULATORY_CARE_PROVIDER_SITE_OTHER): Payer: Self-pay | Admitting: Family Medicine

## 2023-08-01 VITALS — BP 122/88 | HR 80 | Temp 97.8°F | Ht 71.26 in | Wt 233.9 lb

## 2023-08-01 DIAGNOSIS — Z0000 Encounter for general adult medical examination without abnormal findings: Secondary | ICD-10-CM

## 2023-08-01 MED ORDER — PRAMIPEXOLE DIHYDROCHLORIDE 0.25 MG PO TABS: 0.2500 mg | ORAL_TABLET | Freq: Every day | ORAL | 3 refills | Status: DC

## 2023-08-01 NOTE — Progress Notes (Signed)
Established Patient Office Visit  Subjective   Patient ID: Hector Navarro, male    DOB: 1991/06/21  Age: 33 y.o. MRN: [id number]  Chief Complaint  Patient presents with   Annual Exam    HPI   Hector Navarro is seen today for physical exam.  He has history of GERD and restless leg syndrome.  Restless legs controlled with Mirapex.  Needs refills.  He is currently on Prilosec 40 mg daily.  Still has occasional breakthrough GERD symptoms with eating things like pizza.  He is interested in trying to perhaps scale back his dosage if not scaling off PPI.  No recent dysphagia.  Appetite and weight stable.  Health maintenance reviewed.  Tetanus due next year.  Previous hepatitis C screen negative.  Flu vaccine already given.  Social history-married.  He has 3 children ages 49, 70, and 3.  Works for [employer] in [employer].  Non-smoker.  No alcohol.  Family history-mother has history of bipolar disorder.  He has a brother and sister who have had depression history.  No family history of type 2 diabetes or premature CAD or any other cancer  .  His father did have recent CVA and also has hypertension history.  History reviewed. No pertinent past medical history. Past Surgical History:  Procedure Laterality Date   NOSE SURGERY      reports that he has never smoked. He has never used smokeless tobacco. He reports that he does not drink alcohol and does not use drugs. family history includes Bipolar disorder in his mother; Depression in his brother and sister; Diabetes in his paternal grandmother; Hyperlipidemia in his maternal grandfather. No Known Allergies   Review of Systems  Constitutional:  Negative for chills, fever, malaise/fatigue and weight loss.  HENT:  Negative for hearing loss.   Eyes:  Negative for blurred vision and double vision.  Respiratory:  Negative for cough and shortness of breath.   Cardiovascular:  Negative for chest pain, palpitations and leg swelling.  Gastrointestinal:   Negative for abdominal pain, blood in stool, constipation and diarrhea.  Genitourinary:  Negative for dysuria.  Skin:  Negative for rash.  Neurological:  Negative for dizziness, speech change, seizures, loss of consciousness and headaches.  Psychiatric/Behavioral:  Negative for depression.       Objective:     BP 122/88 (BP Location: Left Arm, Cuff Size: Normal)   Pulse 80   Temp 97.8 F (36.6 C) (Oral)   Ht 5' 11.26" (1.81 m)   Wt 233 lb 14.4 oz (106.1 kg)   SpO2 98%   BMI 32.38 kg/m  BP Readings from Last 3 Encounters:  08/01/23 122/88  07/10/22 122/88  11/30/21 125/85   Wt Readings from Last 3 Encounters:  08/01/23 233 lb 14.4 oz (106.1 kg)  07/10/22 238 lb 11.2 oz (108.3 kg)  10/17/21 246 lb 14.4 oz (112 kg)      Physical Exam Constitutional:      General: He is not in acute distress.    Appearance: He is well-developed.  HENT:     Head: Normocephalic and atraumatic.     Right Ear: External ear normal.     Left Ear: External ear normal.  Eyes:     Conjunctiva/sclera: Conjunctivae normal.     Pupils: Pupils are equal, round, and reactive to light.  Neck:     Thyroid: No thyromegaly.  Cardiovascular:     Rate and Rhythm: Normal rate and regular rhythm.     Heart sounds:  Normal heart sounds. No murmur heard. Pulmonary:     Effort: No respiratory distress.     Breath sounds: No wheezing or rales.  Abdominal:     General: Bowel sounds are normal. There is no distension.     Palpations: Abdomen is soft. There is no mass.     Tenderness: There is no abdominal tenderness. There is no guarding or rebound.  Musculoskeletal:     Cervical back: Normal range of motion and neck supple.  Lymphadenopathy:     Cervical: No cervical adenopathy.  Skin:    Findings: No rash.  Neurological:     General: No focal deficit present.     Mental Status: He is alert and oriented to person, place, and time.      No results found for any visits on 08/01/23.    The ASCVD  Risk score (Arnett DK, et al., 2019) failed to calculate for the following reasons:   The 2019 ASCVD risk score is only valid for ages 67 to 40    Assessment & Plan:   Problem List Items Addressed This Visit   None Visit Diagnoses       Physical exam    -  Primary     33 year old male with history of restless leg syndrome controlled with Mirapex.  He has history of GERD currently on Prilosec 40 mg daily.  Has been on this for several years.  He is interested in trying to taper off.  We suggested going to over-the-counter Prilosec 20 mg daily and doing this for at least a month and then consider trying to taper down to Pepcid 20 mg once to twice daily.  Handout on GERD given. -Discussed follow-up labs and he would like to wait till next year. -Continue annual flu vaccine -Needs tetanus booster by next year.  No follow-ups on file.    Evelena Peat, MD

## 2023-08-01 NOTE — Patient Instructions (Signed)
Reduce Prilosec to 20 mg daily for at least one month and then consider transition to OTC Pepcid 20 mg once or twice daily.

## 2023-09-18 ENCOUNTER — Encounter: Payer: Self-pay | Admitting: Family Medicine

## 2023-09-19 MED ORDER — OMEPRAZOLE 40 MG PO CPDR: 40.0000 mg | DELAYED_RELEASE_CAPSULE | Freq: Every day | ORAL | 0 refills | Status: DC

## 2023-09-19 MED ORDER — FLUCONAZOLE 150 MG PO TABS: ORAL_TABLET | ORAL | 0 refills | Status: DC

## 2023-10-04 IMAGING — DX DG ELBOW COMPLETE 3+V*R*
4 series · 4 of 4 positions shown · non-contrast
Comparison: Forearm radiographs 11/30/2021

CLINICAL DATA: Fall from scooter, elbow joint effusion on forearm
radiography.

EXAM:
RIGHT ELBOW - COMPLETE 3+ VIEW

[elbow ap (1 of 3)]
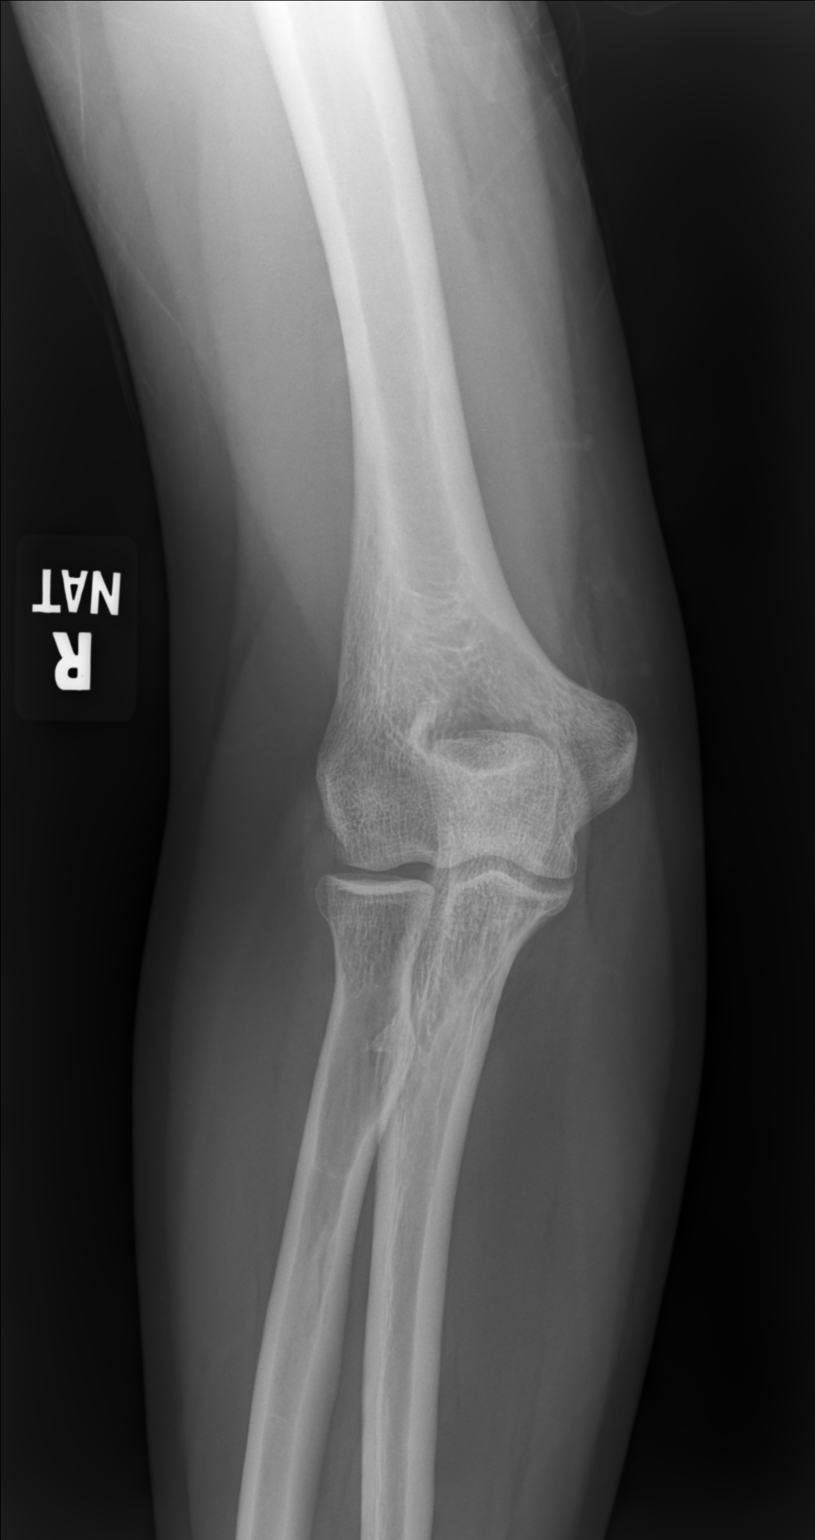

[elbow ap (2 of 3)]
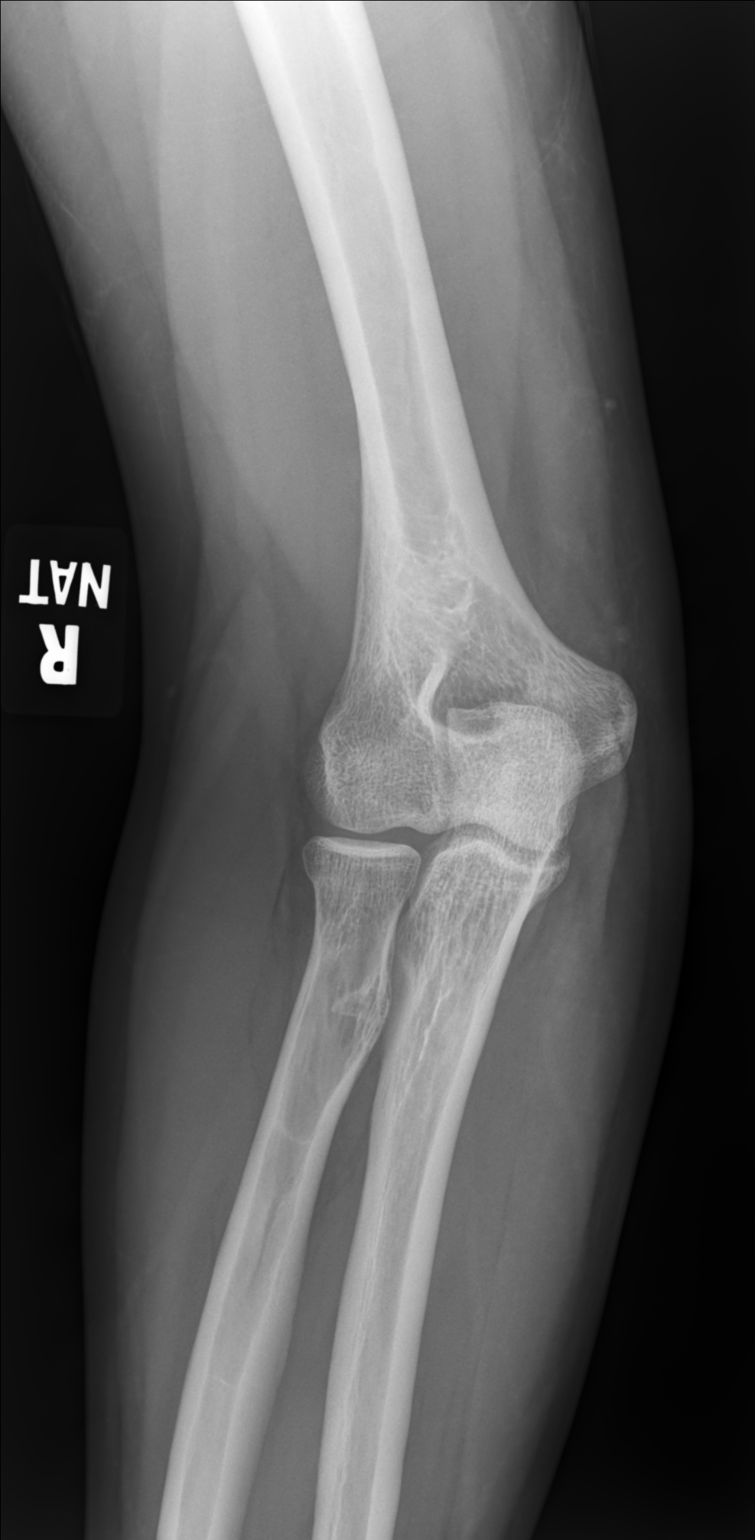

[elbow ap (3 of 3)]
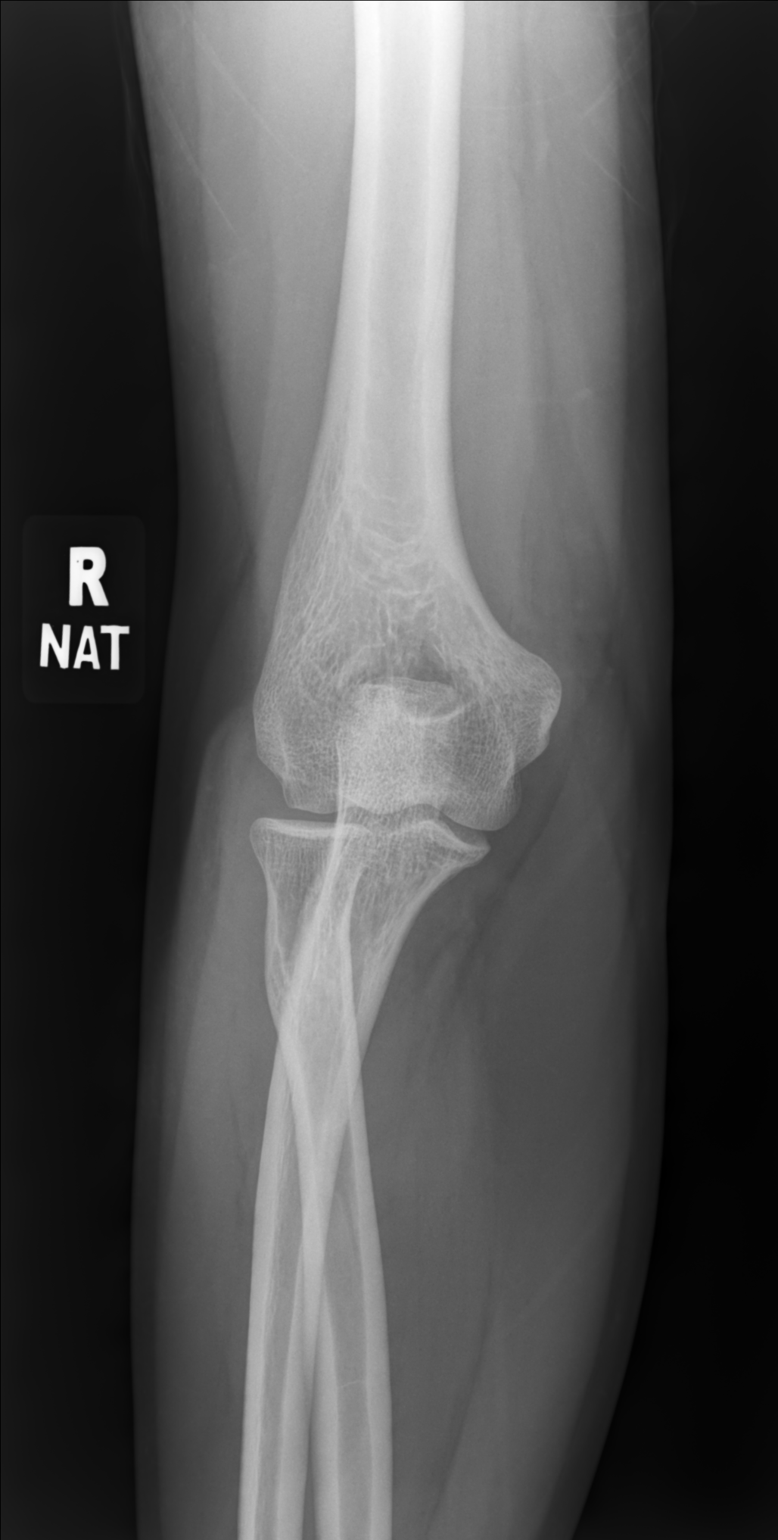

[elbow lat]
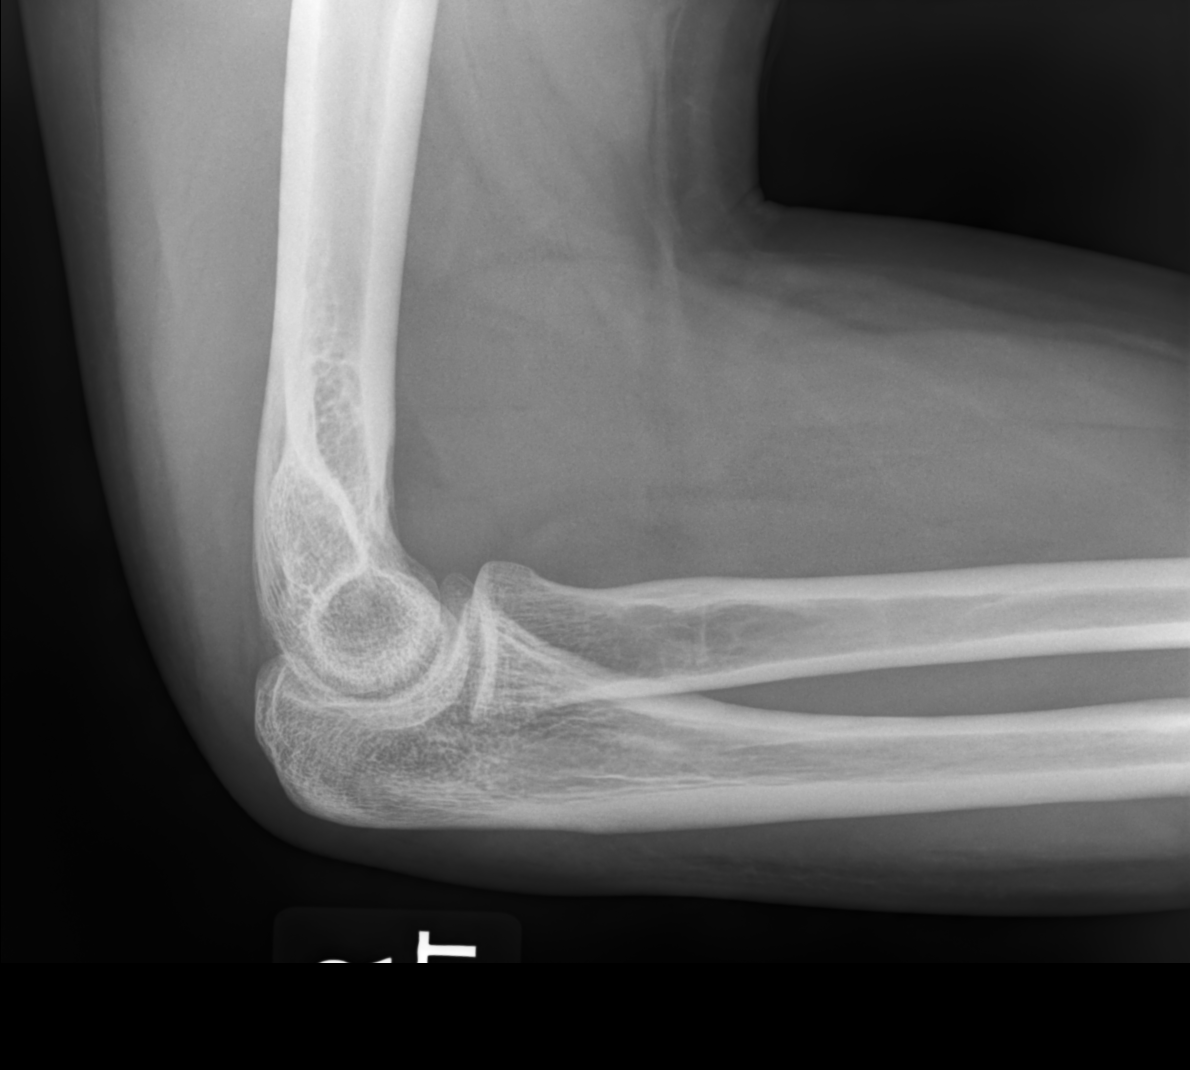

[4 of 4 positions shown; findings below may reference images not displayed]

FINDINGS: Elbow joint effusion confirmed with anterior sail sign noted.
Supinator fat pad unremarkable. The anterior humeral line intersects
the middle third of the capitellum. No well-defined fracture.
IMPRESSION: 1. Elbow joint effusion without well-defined fracture. Supinator fat
pad is not effaced. CT or MRI could be utilized to assess for
radiographically occult fracture if clinically warranted.

## 2023-10-04 IMAGING — DX DG FOREARM 2V*R*
2 series · 2 of 2 positions shown · non-contrast
Comparison: None Available.

CLINICAL DATA: Right forearm pain after fall

EXAM:
RIGHT FOREARM - 2 VIEW

[forearm ap]
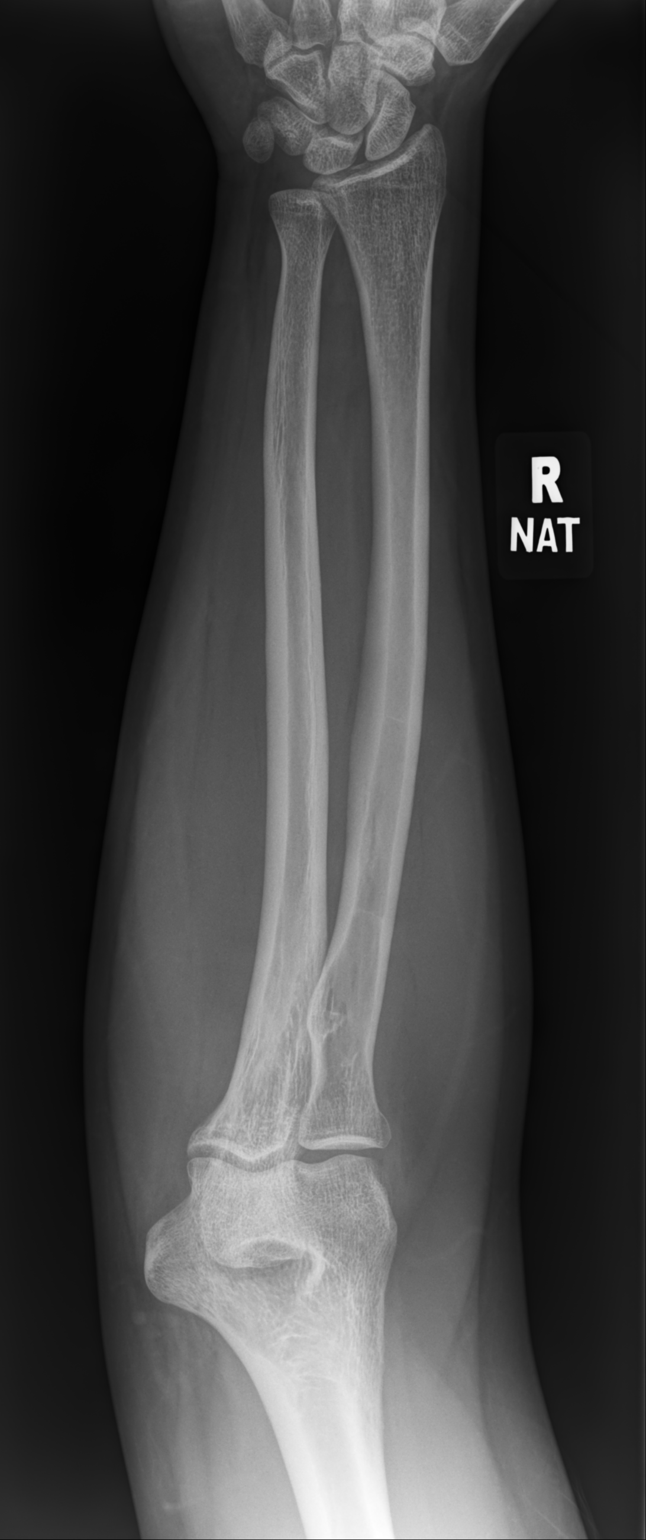

[forearm lat]
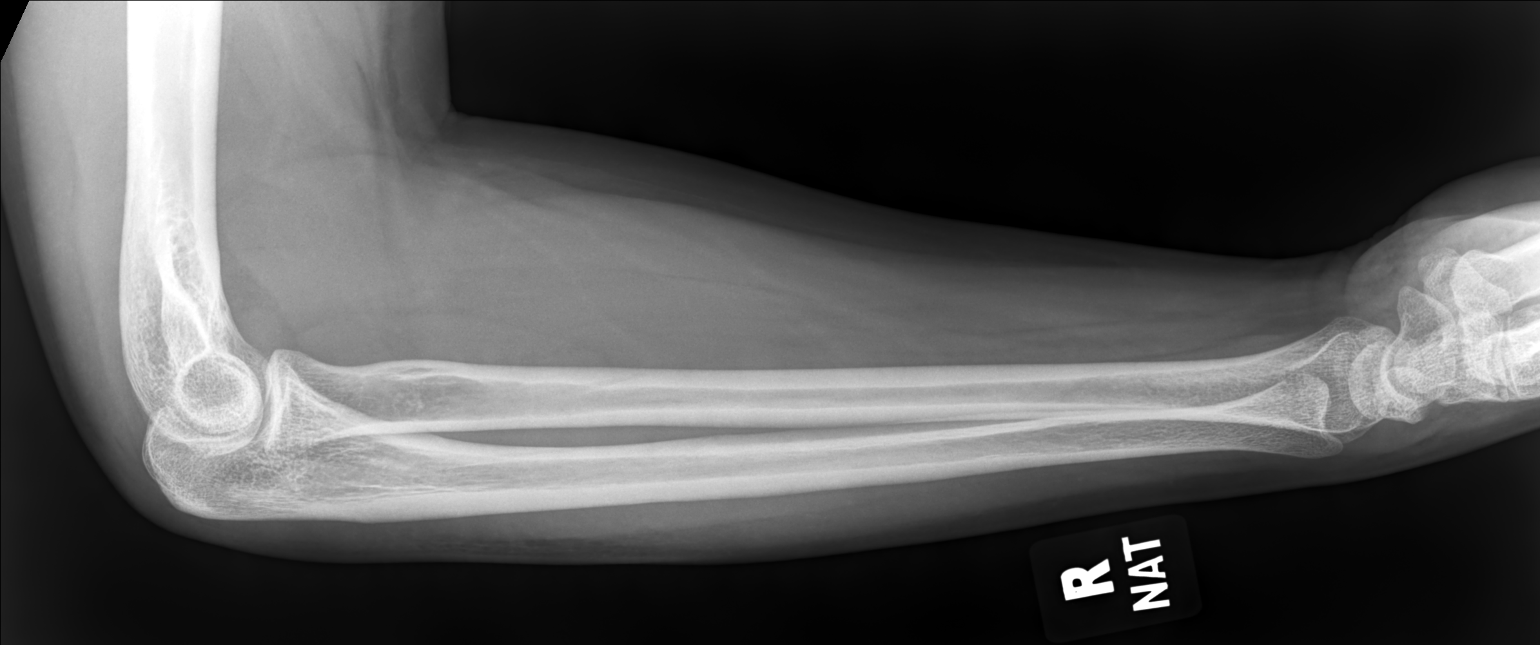

[2 of 2 positions shown; findings below may reference images not displayed]

FINDINGS: Elbow joint effusion is seen on lateral view. No appreciable
fracture line. Radial head and neck appear intact on the included
views. No dislocation. Distal radius and ulna appear intact and
appropriately aligned. Soft tissues within normal limits.
IMPRESSION: Elbow joint effusion without appreciable fracture line. Findings
raise the possibility of an occult fracture and dedicated
radiographs of the right elbow are recommended for further
evaluation.

## 2023-11-16 ENCOUNTER — Other Ambulatory Visit: Payer: Self-pay | Admitting: Family Medicine

## 2024-01-22 ENCOUNTER — Other Ambulatory Visit: Payer: Self-pay | Admitting: Family Medicine

## 2024-03-18 ENCOUNTER — Other Ambulatory Visit: Payer: Self-pay | Admitting: Family Medicine

## 2024-05-26 ENCOUNTER — Other Ambulatory Visit: Payer: Self-pay | Admitting: Family Medicine

## 2024-05-28 ENCOUNTER — Encounter: Payer: Self-pay | Admitting: Family Medicine

## 2024-05-28 MED ORDER — PRAMIPEXOLE DIHYDROCHLORIDE 0.25 MG PO TABS: 0.2500 mg | ORAL_TABLET | Freq: Every day | ORAL | 1 refills | Status: AC

## 2024-07-22 ENCOUNTER — Other Ambulatory Visit: Payer: Self-pay | Admitting: Family Medicine

## 2024-07-23 NOTE — Telephone Encounter (Signed)
 Rx done

## 2024-08-13 ENCOUNTER — Encounter: Payer: Self-pay | Admitting: Family Medicine
